# Patient Record
Sex: Female | Born: 2000 | Race: White | Hispanic: No | Marital: Single | Smoking: Never smoker
Health system: Southern US, Community
[De-identification: ages and names within clinical notes are randomized; demographics above are authoritative.]

---
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
## 2018-09-18 ENCOUNTER — Telehealth: Payer: Self-pay | Admitting: Family Medicine

## 2018-09-18 NOTE — Telephone Encounter (Signed)
She was given a years supply in April of last year- she should not be due, but if she is, let me know.

## 2018-09-18 NOTE — Telephone Encounter (Signed)
Copied from CRM [id number]. Topic: Quick Communication - Rx Refill/Question >> Sep 18, 2018 10:26 AM Baldo Daub L wrote: Medication: norethindrone-ethinyl estradiol (JUNEL FE 1/20) 1-20 MG-MCG tablet  Has the patient contacted their pharmacy? Yes - needs new script (Agent: If no, request that the patient contact the pharmacy for the refill.) (Agent: If yes, when and what did the pharmacy advise?)  Preferred Pharmacy (with phone number or street name): [id number] - [place], [place] - [address] [phone] (Phone) [phone] (Fax)    Agent: Please be advised that RX refills may take up to 3 business days. We ask that you follow-up with your pharmacy.

## 2018-09-18 NOTE — Telephone Encounter (Signed)
Spoke w/ pharmacy. She still has 2 refills available.

## 2018-10-29 ENCOUNTER — Encounter: Payer: 59 | Admitting: Family Medicine

## 2018-10-30 ENCOUNTER — Encounter: Payer: Self-pay | Admitting: Family Medicine

## 2018-10-30 ENCOUNTER — Ambulatory Visit (INDEPENDENT_AMBULATORY_CARE_PROVIDER_SITE_OTHER): Payer: Managed Care, Other (non HMO) | Admitting: Family Medicine

## 2018-10-30 ENCOUNTER — Other Ambulatory Visit: Payer: Self-pay

## 2018-10-30 DIAGNOSIS — Z3041 Encounter for surveillance of contraceptive pills: Secondary | ICD-10-CM | POA: Diagnosis not present

## 2018-10-30 MED ORDER — NORETHIN ACE-ETH ESTRAD-FE 1-20 MG-MCG PO TABS: 1.0000 | ORAL_TABLET | Freq: Every day | ORAL | 3 refills | Status: DC

## 2018-10-30 NOTE — Progress Notes (Signed)
LMP 10/26/2018 (Exact Date)    Subjective:    Patient ID: Katie Bradford, female    DOB: 2001/05/15, 18 y.o.   MRN: [id number]  HPI: Katie Bradford is a 18 y.o. female  Chief Complaint  Patient presents with  . Contraception    needs refill   CONTRACEPTION CONCERNS Contraception: OCP Previous contraception: OCP  Sexual activity: practicing safe sex Average interval between menses: 28 days Length of menses: less than a week  Flow: lighter Dysmenorrhea: no  Relevant past medical, surgical, family and social history reviewed and updated as indicated. Interim medical history since our last visit reviewed. Allergies and medications reviewed and updated.  Review of Systems  Constitutional: Negative.   Respiratory: Negative.   Cardiovascular: Negative.   Psychiatric/Behavioral: Negative.     Per HPI unless specifically indicated above     Objective:    LMP 10/26/2018 (Exact Date)   Wt Readings from Last 3 Encounters:  10/21/17 142 lb 9.6 oz (64.7 kg) (75 %, Z= 0.67)*  10/18/16 132 lb (59.9 kg) (65 %, Z= 0.37)*  03/28/16 135 lb (61.2 kg) (71 %, Z= 0.55)*   * Growth percentiles are based on CDC (Girls, 2-20 Years) data.    Physical Exam Vitals signs and nursing note reviewed.  Constitutional:      General: She is not in acute distress.    Appearance: Normal appearance. She is not ill-appearing, toxic-appearing or diaphoretic.  HENT:     Head: Normocephalic and atraumatic.     Right Ear: External ear normal.     Left Ear: External ear normal.     Nose: Nose normal.     Mouth/Throat:     Mouth: Mucous membranes are moist.     Pharynx: Oropharynx is clear.  Eyes:     General: No scleral icterus.       Right eye: No discharge.        Left eye: No discharge.     Conjunctiva/sclera: Conjunctivae normal.     Pupils: Pupils are equal, round, and reactive to light.  Neck:     Musculoskeletal: Normal range of motion.  Pulmonary:     Effort: Pulmonary effort is  normal. No respiratory distress.     Comments: Speaking in full sentences Musculoskeletal: Normal range of motion.  Skin:    Coloration: Skin is not jaundiced or pale.     Findings: No bruising, erythema, lesion or rash.  Neurological:     Mental Status: She is alert and oriented to person, place, and time. Mental status is at baseline.  Psychiatric:        Mood and Affect: Mood normal.        Behavior: Behavior normal.        Thought Content: Thought content normal.        Judgment: Judgment normal.     Results for orders placed or performed in visit on 10/21/17  GC/Chlamydia Probe Amp  Result Value Ref Range   Chlamydia trachomatis, NAA Negative Negative   Neisseria gonorrhoeae by PCR Negative Negative  Microscopic Examination  Result Value Ref Range   WBC, UA 0-5 0 - 5 /hpf   RBC, UA None seen 0 - 2 /hpf   Epithelial Cells (non renal) 0-10 0 - 10 /hpf   Bacteria, UA Few None seen/Few  Urine Culture, Reflex  Result Value Ref Range   Urine Culture, Routine Final report    Organism ID, Bacteria Comment   HIV antibody (with reflex)  Result Value  Ref Range   HIV Screen 4th Generation wRfx Non Reactive Non Reactive  CBC with Differential/Platelet  Result Value Ref Range   WBC 5.3 3.4 - 10.8 x10E3/uL   RBC 4.81 3.77 - 5.28 x10E6/uL   Hemoglobin 14.5 11.1 - 15.9 g/dL   Hematocrit 16.143.2 09.634.0 - 46.6 %   MCV 90 79 - 97 fL   MCH 30.1 26.6 - 33.0 pg   MCHC 33.6 31.5 - 35.7 g/dL   RDW 04.513.2 40.912.3 - 81.115.4 %   Platelets 316 150 - 379 x10E3/uL   Neutrophils 48 Not Estab. %   Lymphs 41 Not Estab. %   Monocytes 9 Not Estab. %   Eos 2 Not Estab. %   Basos 0 Not Estab. %   Neutrophils Absolute 2.5 1.4 - 7.0 x10E3/uL   Lymphocytes Absolute 2.2 0.7 - 3.1 x10E3/uL   Monocytes Absolute 0.5 0.1 - 0.9 x10E3/uL   EOS (ABSOLUTE) 0.1 0.0 - 0.4 x10E3/uL   Basophils Absolute 0.0 0.0 - 0.2 x10E3/uL   Immature Granulocytes 0 Not Estab. %   Immature Grans (Abs) 0.0 0.0 - 0.1 x10E3/uL   Comprehensive metabolic panel  Result Value Ref Range   Glucose 77 65 - 99 mg/dL   BUN 10 6 - 20 mg/dL   Creatinine, Ser 9.140.86 0.57 - 1.00 mg/dL   GFR calc non Af Amer 98 >59 mL/min/1.73   GFR calc Af Amer 113 >59 mL/min/1.73   BUN/Creatinine Ratio 12 9 - 23   Sodium 139 134 - 144 mmol/L   Potassium 3.7 3.5 - 5.2 mmol/L   Chloride 100 96 - 106 mmol/L   CO2 24 20 - 29 mmol/L   Calcium 9.8 8.7 - 10.2 mg/dL   Total Protein 7.2 6.0 - 8.5 g/dL   Albumin 4.2 3.5 - 5.5 g/dL   Globulin, Total 3.0 1.5 - 4.5 g/dL   Albumin/Globulin Ratio 1.4 1.2 - 2.2   Bilirubin Total 0.8 0.0 - 1.2 mg/dL   Alkaline Phosphatase 76 39 - 117 IU/L   AST 27 0 - 40 IU/L   ALT 21 0 - 32 IU/L  Lipid Panel w/o Chol/HDL Ratio  Result Value Ref Range   Cholesterol, Total 179 (H) 100 - 169 mg/dL   Triglycerides 782129 (H) 0 - 89 mg/dL   HDL 54 >95>39 mg/dL   VLDL Cholesterol Cal 26 5 - 40 mg/dL   LDL Calculated 99 0 - 109 mg/dL  TSH  Result Value Ref Range   TSH 1.730 0.450 - 4.500 uIU/mL  UA/M w/rflx Culture, Routine  Result Value Ref Range   Specific Gravity, UA 1.015 1.005 - 1.030   pH, UA 6.0 5.0 - 7.5   Color, UA Yellow Yellow   Appearance Ur Hazy (A) Clear   Leukocytes, UA 1+ (A) Negative   Protein, UA Negative Negative/Trace   Glucose, UA Negative Negative   Ketones, UA Negative Negative   RBC, UA Negative Negative   Bilirubin, UA Negative Negative   Urobilinogen, Ur 0.2 0.2 - 1.0 mg/dL   Nitrite, UA Negative Negative   Microscopic Examination See below:    Urinalysis Reflex Comment   HSV(herpes simplex vrs) 1+2 ab-IgG  Result Value Ref Range   HSV 1 Glycoprotein G Ab, IgG <0.91 0.00 - 0.90 index   HSV 2 IgG, Type Spec <0.91 0.00 - 0.90 index  RPR  Result Value Ref Range   RPR Ser Ql Non Reactive Non Reactive      Assessment & Plan:   Problem  List Items Addressed This Visit    None    Visit Diagnoses    Encounter for surveillance of contraceptive pills    -  Primary   Tolerating medicine  well. No concerns. Refills given. Will follow up for physical after pandemic.        Follow up plan: Return in about 3 months (around 01/29/2019) for Physical.    . This visit was completed via FaceTime due to the restrictions of the COVID-19 pandemic. All issues as above were discussed and addressed. Physical exam was done as above through visual confirmation on FaceTime. If it was felt that the patient should be evaluated in the office, they were directed there. The patient verbally consented to this visit. . Location of the patient: home . Location of the provider: home . Those involved with this call:  . Provider: Olevia Perches, DO . CMA: Sheilah Mins, CMA . Front Desk/Registration: Adela Ports  . Time spent on call: 15 minutes with patient face to face via video conference. More than 50% of this time was spent in counseling and coordination of care. 23 minutes total spent in review of patient's record and preparation of their chart.

## 2018-12-11 ENCOUNTER — Other Ambulatory Visit: Payer: Self-pay | Admitting: Family Medicine

## 2019-01-20 ENCOUNTER — Encounter: Payer: Self-pay | Admitting: Family Medicine

## 2019-01-20 ENCOUNTER — Other Ambulatory Visit: Payer: Self-pay

## 2019-01-20 ENCOUNTER — Ambulatory Visit (INDEPENDENT_AMBULATORY_CARE_PROVIDER_SITE_OTHER): Payer: Managed Care, Other (non HMO) | Admitting: Family Medicine

## 2019-01-20 VITALS — BP 117/77 | HR 80 | Temp 100.1°F | Ht 64.2 in | Wt 139.0 lb

## 2019-01-20 DIAGNOSIS — Z0000 Encounter for general adult medical examination without abnormal findings: Secondary | ICD-10-CM | POA: Diagnosis not present

## 2019-01-20 DIAGNOSIS — R509 Fever, unspecified: Secondary | ICD-10-CM

## 2019-01-20 NOTE — Progress Notes (Signed)
BP 117/77   Pulse 80   Temp 100.1 F (37.8 C) (Oral)   Ht 5' 4.2" (1.631 m)   Wt 139 lb (63 kg)   SpO2 98%   BMI 23.71 kg/m    Subjective:    Patient ID: Katie Bradford, female    DOB: August 21, 2000, 18 y.o.   MRN: [id number]  HPI: Katie Bradford is a 18 y.o. female presenting on 01/20/2019 for comprehensive medical examination. Current medical complaints include: Fever- she didn't know she had one. Otherwise feeling well. No sign of URI symptoms. Has not been around any known exposures. No other concerns or complaints  Menopausal Symptoms: no  Depression Screen done today and results listed below:  [facility] 01/20/2019 10/21/2017 10/18/2016 05/06/2015  Decreased Interest 0 0 0 0  Down, Depressed, Hopeless 0 0 0 0  PHQ - 2 Score 0 0 0 0  Altered sleeping 0 0 - -  Tired, decreased energy 0 1 - -  Change in appetite 0 1 - -  Feeling bad or failure about yourself  0 1 - -  Trouble concentrating 0 0 - -  Moving slowly or fidgety/restless 0 0 - -  Suicidal thoughts 0 0 - -  PHQ-9 Score 0 3 - -  Difficult doing work/chores Not difficult at all - - -    Past Medical History:  History reviewed. No pertinent past medical history.  Surgical History:  History reviewed. No pertinent surgical history.  Medications:  Current Outpatient Medications on File Prior to Visit  Medication Sig  . DORYX MPC 120 MG TBEC Take 1 tablet by mouth daily.  Marland Kitchen. EPIDUO FORTE 0.3-2.5 % GEL Apply A pea sized AMOUNT TO THE entire FACE EVERY NIGHT AT BEDTIME  . JUNEL FE 1/20 1-20 MG-MCG tablet TAKE 1 TABLET BY MOUTH EVERY DAY   No current facility-administered medications on file prior to visit.     Allergies:  No Known Allergies  Social History:  Social History   Socioeconomic History  . Marital status: Single    Spouse name: Not on file  . Number of children: Not on file  . Years of education: Not on file  . Highest education level: Not on file  Occupational History  . Not on  file  Social Needs  . Financial resource strain: Not on file  . Food insecurity    Worry: Not on file    Inability: Not on file  . Transportation needs    Medical: Not on file    Non-medical: Not on file  Tobacco Use  . Smoking status: Never Smoker  . Smokeless tobacco: Never Used  Substance and Sexual Activity  . Alcohol use: No  . Drug use: No  . Sexual activity: Not on file  Lifestyle  . Physical activity    Days per week: Not on file    Minutes per session: Not on file  . Stress: Not on file  Relationships  . Social Musicianconnections    Talks on phone: Not on file    Gets together: Not on file    Attends religious service: Not on file    Active member of club or organization: Not on file    Attends meetings of clubs or organizations: Not on file    Relationship status: Not on file  . Intimate partner violence    Fear of current or ex partner: Not on file    Emotionally abused: Not on file    Physically abused:  Not on file    Forced sexual activity: Not on file  Other Topics Concern  . Not on file  Social History Narrative  . Not on file   Social History   Tobacco Use  Smoking Status Never Smoker  Smokeless Tobacco Never Used   Social History   Substance and Sexual Activity  Alcohol Use No    Family History:  Family History  Problem Relation Age of Onset  . Heart disease Father   . Hypertension Father   . Cancer Maternal Grandfather        pancreatic  . Diabetes Paternal Aunt   . Diabetes Paternal Uncle   . Diabetes Paternal Grandmother   . Stroke Neg Hx   . COPD Neg Hx     Past medical history, surgical history, medications, allergies, family history and social history reviewed with patient today and changes made to appropriate areas of the chart.   Review of Systems  Constitutional: Positive for fever. Negative for chills, diaphoresis, malaise/fatigue and weight loss.  HENT: Negative.   Eyes: Negative.   Respiratory: Negative.   Cardiovascular:  Negative.   Gastrointestinal: Negative.   Genitourinary: Negative.   Musculoskeletal: Negative.   Skin: Negative.   Neurological: Negative.   Endo/Heme/Allergies: Negative.   Psychiatric/Behavioral: Negative.     All other ROS negative except what is listed above and in the HPI.      Objective:    BP 117/77   Pulse 80   Temp 100.1 F (37.8 C) (Oral)   Ht 5' 4.2" (1.631 m)   Wt 139 lb (63 kg)   SpO2 98%   BMI 23.71 kg/m   Wt Readings from Last 3 Encounters:  01/20/19 139 lb (63 kg)  10/21/17 142 lb 9.6 oz (64.7 kg) (75 %, Z= 0.67)*  10/18/16 132 lb (59.9 kg) (65 %, Z= 0.37)*   * Growth percentiles are based on CDC (Girls, 2-20 Years) data.    Physical Exam Vitals signs and nursing note reviewed.  Constitutional:      General: She is not in acute distress.    Appearance: Normal appearance. She is not ill-appearing, toxic-appearing or diaphoretic.  HENT:     Head: Normocephalic and atraumatic.     Right Ear: Tympanic membrane, ear canal and external ear normal. There is no impacted cerumen.     Left Ear: Tympanic membrane, ear canal and external ear normal. There is no impacted cerumen.     Nose: Nose normal. No congestion or rhinorrhea.     Mouth/Throat:     Mouth: Mucous membranes are moist.     Pharynx: Oropharynx is clear. No oropharyngeal exudate or posterior oropharyngeal erythema.  Eyes:     General: No scleral icterus.       Right eye: No discharge.        Left eye: No discharge.     Extraocular Movements: Extraocular movements intact.     Conjunctiva/sclera: Conjunctivae normal.     Pupils: Pupils are equal, round, and reactive to light.  Neck:     Musculoskeletal: Normal range of motion and neck supple. No neck rigidity or muscular tenderness.     Vascular: No carotid bruit.  Cardiovascular:     Rate and Rhythm: Normal rate and regular rhythm.     Pulses: Normal pulses.     Heart sounds: No murmur. No friction rub. No gallop.   Pulmonary:     Effort:  Pulmonary effort is normal. No respiratory distress.  Breath sounds: Normal breath sounds. No stridor. No wheezing, rhonchi or rales.  Chest:     Chest wall: No tenderness.  Abdominal:     General: Abdomen is flat. Bowel sounds are normal. There is no distension.     Palpations: Abdomen is soft. There is no mass.     Tenderness: There is no abdominal tenderness. There is no right CVA tenderness, left CVA tenderness, guarding or rebound.     Hernia: No hernia is present.  Genitourinary:    Comments: Breast and pelvic exams deferred with shared decision making Musculoskeletal:        General: No swelling, tenderness, deformity or signs of injury.     Right lower leg: No edema.     Left lower leg: No edema.  Lymphadenopathy:     Cervical: No cervical adenopathy.  Skin:    General: Skin is warm and dry.     Capillary Refill: Capillary refill takes less than 2 seconds.     Coloration: Skin is not jaundiced or pale.     Findings: No bruising, erythema, lesion or rash.  Neurological:     General: No focal deficit present.     Mental Status: She is alert and oriented to person, place, and time. Mental status is at baseline.     Cranial Nerves: No cranial nerve deficit.     Sensory: No sensory deficit.     Motor: No weakness.     Coordination: Coordination normal.     Gait: Gait normal.     Deep Tendon Reflexes: Reflexes normal.  Psychiatric:        Mood and Affect: Mood normal.        Behavior: Behavior normal.        Thought Content: Thought content normal.        Judgment: Judgment normal.     Results for orders placed or performed in visit on 10/21/17  GC/Chlamydia Probe Amp   Specimen: Urine   UR  Result Value Ref Range   Chlamydia trachomatis, NAA Negative Negative   Neisseria gonorrhoeae by PCR Negative Negative  Microscopic Examination   URINE  Result Value Ref Range   WBC, UA 0-5 0 - 5 /hpf   RBC, UA None seen 0 - 2 /hpf   Epithelial Cells (non renal) 0-10 0 - 10  /hpf   Bacteria, UA Few None seen/Few  Urine Culture, Reflex   URINE  Result Value Ref Range   Urine Culture, Routine Final report    Organism ID, Bacteria Comment   HIV antibody (with reflex)  Result Value Ref Range   HIV Screen 4th Generation wRfx Non Reactive Non Reactive  CBC with Differential/Platelet  Result Value Ref Range   WBC 5.3 3.4 - 10.8 x10E3/uL   RBC 4.81 3.77 - 5.28 x10E6/uL   Hemoglobin 14.5 11.1 - 15.9 g/dL   Hematocrit 16.1 09.6 - 46.6 %   MCV 90 79 - 97 fL   MCH 30.1 26.6 - 33.0 pg   MCHC 33.6 31.5 - 35.7 g/dL   RDW 04.5 40.9 - 81.1 %   Platelets 316 150 - 379 x10E3/uL   Neutrophils 48 Not Estab. %   Lymphs 41 Not Estab. %   Monocytes 9 Not Estab. %   Eos 2 Not Estab. %   Basos 0 Not Estab. %   Neutrophils Absolute 2.5 1.4 - 7.0 x10E3/uL   Lymphocytes Absolute 2.2 0.7 - 3.1 x10E3/uL   Monocytes Absolute 0.5 0.1 - 0.9 x10E3/uL  EOS (ABSOLUTE) 0.1 0.0 - 0.4 x10E3/uL   Basophils Absolute 0.0 0.0 - 0.2 x10E3/uL   Immature Granulocytes 0 Not Estab. %   Immature Grans (Abs) 0.0 0.0 - 0.1 x10E3/uL  Comprehensive metabolic panel  Result Value Ref Range   Glucose 77 65 - 99 mg/dL   BUN 10 6 - 20 mg/dL   Creatinine, Ser 1.300.86 0.57 - 1.00 mg/dL   GFR calc non Af Amer 98 >59 mL/min/1.73   GFR calc Af Amer 113 >59 mL/min/1.73   BUN/Creatinine Ratio 12 9 - 23   Sodium 139 134 - 144 mmol/L   Potassium 3.7 3.5 - 5.2 mmol/L   Chloride 100 96 - 106 mmol/L   CO2 24 20 - 29 mmol/L   Calcium 9.8 8.7 - 10.2 mg/dL   Total Protein 7.2 6.0 - 8.5 g/dL   Albumin 4.2 3.5 - 5.5 g/dL   Globulin, Total 3.0 1.5 - 4.5 g/dL   Albumin/Globulin Ratio 1.4 1.2 - 2.2   Bilirubin Total 0.8 0.0 - 1.2 mg/dL   Alkaline Phosphatase 76 39 - 117 IU/L   AST 27 0 - 40 IU/L   ALT 21 0 - 32 IU/L  Lipid Panel w/o Chol/HDL Ratio  Result Value Ref Range   Cholesterol, Total 179 (H) 100 - 169 mg/dL   Triglycerides 865129 (H) 0 - 89 mg/dL   HDL 54 >78>39 mg/dL   VLDL Cholesterol Cal 26 5 - 40 mg/dL    LDL Calculated 99 0 - 109 mg/dL  TSH  Result Value Ref Range   TSH 1.730 0.450 - 4.500 uIU/mL  UA/M w/rflx Culture, Routine   Specimen: Urine   URINE  Result Value Ref Range   Specific Gravity, UA 1.015 1.005 - 1.030   pH, UA 6.0 5.0 - 7.5   Color, UA Yellow Yellow   Appearance Ur Hazy (A) Clear   Leukocytes, UA 1+ (A) Negative   Protein, UA Negative Negative/Trace   Glucose, UA Negative Negative   Ketones, UA Negative Negative   RBC, UA Negative Negative   Bilirubin, UA Negative Negative   Urobilinogen, Ur 0.2 0.2 - 1.0 mg/dL   Nitrite, UA Negative Negative   Microscopic Examination See below:    Urinalysis Reflex Comment   HSV(herpes simplex vrs) 1+2 ab-IgG  Result Value Ref Range   HSV 1 Glycoprotein G Ab, IgG <0.91 0.00 - 0.90 index   HSV 2 IgG, Type Spec <0.91 0.00 - 0.90 index  RPR  Result Value Ref Range   RPR Ser Ql Non Reactive Non Reactive      Assessment & Plan:   Problem List Items Addressed This Visit    None    Visit Diagnoses    Routine general medical examination at a health care facility    -  Primary   Vaccines up to date. Screening labs checked today. Continue diet and exercise. Call with any concerns.    Relevant Orders   CBC with Differential/Platelet   Comprehensive metabolic panel   Lipid Panel w/o Chol/HDL Ratio   TSH   UA/M w/rflx Culture, Routine   Fever, unspecified fever cause       Otherwise asymptomatic. Will get her set up for COVID-19 testing. self-quarantine until results come back.    Relevant Orders   Novel Coronavirus, NAA ([facility])       Follow up plan: Return in about 1 year (around 01/20/2020) for Physical.   LABORATORY TESTING:  - Pap smear: not applicable  IMMUNIZATIONS:   -  Tdap: Tetanus vaccination status reviewed: last tetanus booster within 10 years. - Influenza: Postponed to flu season - Pneumovax: Not applicable   PATIENT COUNSELING:   Advised to take 1 mg of folate supplement per day if capable of  pregnancy.   Sexuality: Discussed sexually transmitted diseases, partner selection, use of condoms, avoidance of unintended pregnancy  and contraceptive alternatives.   Advised to avoid cigarette smoking.  I discussed with the patient that most people either abstain from alcohol or drink within safe limits (<=14/week and <=4 drinks/occasion for males, <=7/weeks and <= 3 drinks/occasion for females) and that the risk for alcohol disorders and other health effects rises proportionally with the number of drinks per week and how often a drinker exceeds daily limits.  Discussed cessation/primary prevention of drug use and availability of treatment for abuse.   Diet: Encouraged to adjust caloric intake to maintain  or achieve ideal body weight, to reduce intake of dietary saturated fat and total fat, to limit sodium intake by avoiding high sodium foods and not adding table salt, and to maintain adequate dietary potassium and calcium preferably from fresh fruits, vegetables, and low-fat dairy products.    stressed the importance of regular exercise  Injury prevention: Discussed safety belts, safety helmets, smoke detector, smoking near bedding or upholstery.   Dental health: Discussed importance of regular tooth brushing, flossing, and dental visits.    NEXT PREVENTATIVE PHYSICAL DUE IN 1 YEAR. Return in about 1 year (around 01/20/2020) for Physical.

## 2019-01-20 NOTE — Patient Instructions (Addendum)
We are putting in the order for you to have your COVID testing done. [facility]. You can go for testing from 8AM to 3:30PM Monday-Friday. The results are taking 7-10 days to come back. We will call you with your test results when they come back. While you are waiting for your results, you need to self-quarantine. Self-quarantine means that you do not leave your house. You do not go to the store or drive around. You try to avoid the other people who live with you (stay in a different room and wear a mask when you have to be in contact with them.) Your family members and house-mates need to also be aware that you are being tested in case they develop any symptoms. If you become significantly worse and cannot breathe while waiting for results, call 911. If you have any questions or are concerned while you're waiting for your results- please call our office or send a mychart message.    Health Maintenance, Female Adopting a healthy lifestyle and getting preventive care are important in promoting health and wellness. Ask your health care provider about:  The right schedule for you to have regular tests and exams.  Things you can do on your own to prevent diseases and keep yourself healthy. What should I know about diet, weight, and exercise? Eat a healthy diet   Eat a diet that includes plenty of vegetables, fruits, low-fat dairy products, and lean protein.  Do not eat a lot of foods that are high in solid fats, added sugars, or sodium. Maintain a healthy weight Body mass index (BMI) is used to identify weight problems. It estimates body fat based on height and weight. Your health care provider can help determine your BMI and help you achieve or maintain a healthy weight. Get regular exercise Get regular exercise. This is one of the most important things you can do for your health. Most adults should:  Exercise for at least 150 minutes  each week. The exercise should increase your heart rate and make you sweat (moderate-intensity exercise).  Do strengthening exercises at least twice a week. This is in addition to the moderate-intensity exercise.  Spend less time sitting. Even light physical activity can be beneficial. Watch cholesterol and blood lipids Have your blood tested for lipids and cholesterol at 20 years of age, then have this test every 5 years. Have your cholesterol levels checked more often if:  Your lipid or cholesterol levels are high.  You are older than 20 years of age.  You are at high risk for heart disease. What should I know about cancer screening? Depending on your health history and family history, you may need to have cancer screening at various ages. This may include screening for:  Breast cancer.  Cervical cancer.  Colorectal cancer.  Skin cancer.  Lung cancer. What should I know about heart disease, diabetes, and high blood pressure? Blood pressure and heart disease  High blood pressure causes heart disease and increases the risk of stroke. This is more likely to develop in people who have high blood pressure readings, are of African descent, or are overweight.  Have your blood pressure checked: ? Every 3-5 years if you are 8518-839 years of age. ? Every year if you are 20 years old or older. Diabetes Have regular diabetes screenings. This checks your fasting blood sugar level. Have the screening done:  Once every three years after age 20  if you are at a normal weight and have a low risk for diabetes.  More often and at a younger age if you are overweight or have a high risk for diabetes. What should I know about preventing infection? Hepatitis B If you have a higher risk for hepatitis B, you should be screened for this virus. Talk with your health care provider to find out if you are at risk for hepatitis B infection. Hepatitis C Testing is recommended for:  Everyone born from  [birth year] through 1967.  Anyone with known risk factors for hepatitis C. Sexually transmitted infections (STIs)  Get screened for STIs, including gonorrhea and chlamydia, if: ? You are sexually active and are younger than 20 years of age. ? You are older than 20 years of age and your health care provider tells you that you are at risk for this type of infection. ? Your sexual activity has changed since you were last screened, and you are at increased risk for chlamydia or gonorrhea. Ask your health care provider if you are at risk.  Ask your health care provider about whether you are at high risk for HIV. Your health care provider may recommend a prescription medicine to help prevent HIV infection. If you choose to take medicine to prevent HIV, you should first get tested for HIV. You should then be tested every 3 months for as long as you are taking the medicine. Pregnancy  If you are about to stop having your period (premenopausal) and you may become pregnant, seek counseling before you get pregnant.  Take 400 to 800 micrograms (mcg) of folic acid every day if you become pregnant.  Ask for birth control (contraception) if you want to prevent pregnancy. Osteoporosis and menopause Osteoporosis is a disease in which the bones lose minerals and strength with aging. This can result in bone fractures. If you are 22 years old or older, or if you are at risk for osteoporosis and fractures, ask your health care provider if you should:  Be screened for bone loss.  Take a calcium or vitamin D supplement to lower your risk of fractures.  Be given hormone replacement therapy (HRT) to treat symptoms of menopause. Follow these instructions at home: Lifestyle  Do not use any products that contain nicotine or tobacco, such as cigarettes, e-cigarettes, and chewing tobacco. If you need help quitting, ask your health care provider.  Do not use street drugs.  Do not share needles.  Ask your health care  provider for help if you need support or information about quitting drugs. Alcohol use  Do not drink alcohol if: ? Your health care provider tells you not to drink. ? You are pregnant, may be pregnant, or are planning to become pregnant.  If you drink alcohol: ? Limit how much you use to 0-1 drink a day. ? Limit intake if you are breastfeeding.  Be aware of how much alcohol is in your drink. In the [place] one drink equals one 12 oz bottle of beer (355 mL), one 5 oz glass of wine (148 mL), or one 1 oz glass of hard liquor (44 mL). General instructions  Schedule regular health, dental, and eye exams.  Stay current with your vaccines.  Tell your health care provider if: ? You often feel depressed. ? You have ever been abused or do not feel safe at home. Summary  Adopting a healthy lifestyle and getting preventive care are important in promoting health and wellness.  Follow your health care  provider's instructions about healthy diet, exercising, and getting tested or screened for diseases.  Follow your health care provider's instructions on monitoring your cholesterol and blood pressure. This information is not intended to replace advice given to you by your health care provider. Make sure you discuss any questions you have with your health care provider. Document Released: 01/01/2011 Document Revised: 06/11/2018 Document Reviewed: 06/11/2018 Elsevier Patient Education  2020 [employer].

## 2019-01-21 ENCOUNTER — Encounter: Payer: Self-pay | Admitting: Family Medicine

## 2019-01-21 LAB — CBC WITH DIFFERENTIAL/PLATELET
Basophils Absolute: 0 10*3/uL (ref 0.0–0.2)
Basos: 1 %
EOS (ABSOLUTE): 0.1 10*3/uL (ref 0.0–0.4)
Eos: 2 %
Hematocrit: 43.9 % (ref 34.0–46.6)
Hemoglobin: 14.7 g/dL (ref 11.1–15.9)
Immature Grans (Abs): 0 10*3/uL (ref 0.0–0.1)
Immature Granulocytes: 0 %
Lymphocytes Absolute: 2.3 10*3/uL (ref 0.7–3.1)
Lymphs: 42 %
MCH: 31 pg (ref 26.6–33.0)
MCHC: 33.5 g/dL (ref 31.5–35.7)
MCV: 93 fL (ref 79–97)
Monocytes Absolute: 0.3 10*3/uL (ref 0.1–0.9)
Monocytes: 6 %
Neutrophils Absolute: 2.7 10*3/uL (ref 1.4–7.0)
Neutrophils: 49 %
Platelets: 244 10*3/uL (ref 150–450)
RBC: 4.74 x10E6/uL (ref 3.77–5.28)
RDW: 12.2 % (ref 11.7–15.4)
WBC: 5.5 10*3/uL (ref 3.4–10.8)

## 2019-01-21 LAB — TSH: TSH: 2.47 u[IU]/mL (ref 0.450–4.500)

## 2019-01-21 LAB — COMPREHENSIVE METABOLIC PANEL
ALT: 9 IU/L (ref 0–32)
AST: 19 IU/L (ref 0–40)
Albumin/Globulin Ratio: 1.6 (ref 1.2–2.2)
Albumin: 4.3 g/dL (ref 3.9–5.0)
Alkaline Phosphatase: 74 IU/L (ref 39–117)
BUN/Creatinine Ratio: 10 (ref 9–23)
BUN: 10 mg/dL (ref 6–20)
Bilirubin Total: 1.2 mg/dL (ref 0.0–1.2)
CO2: 21 mmol/L (ref 20–29)
Calcium: 9.3 mg/dL (ref 8.7–10.2)
Chloride: 103 mmol/L (ref 96–106)
Creatinine, Ser: 1 mg/dL (ref 0.57–1.00)
GFR calc Af Amer: 94 mL/min/{1.73_m2} (ref >59)
GFR calc non Af Amer: 81 mL/min/{1.73_m2} (ref >59)
Globulin, Total: 2.7 g/dL (ref 1.5–4.5)
Glucose: 87 mg/dL (ref 65–99)
Potassium: 4.1 mmol/L (ref 3.5–5.2)
Sodium: 140 mmol/L (ref 134–144)
Total Protein: 7 g/dL (ref 6.0–8.5)

## 2019-01-21 LAB — LIPID PANEL W/O CHOL/HDL RATIO
Cholesterol, Total: 177 mg/dL (ref 100–199)
HDL: 47 mg/dL (ref >39)
LDL Calculated: 106 mg/dL — ABNORMAL HIGH (ref 0–99)
Triglycerides: 119 mg/dL (ref 0–149)
VLDL Cholesterol Cal: 24 mg/dL (ref 5–40)

## 2019-01-22 ENCOUNTER — Telehealth: Payer: Self-pay | Admitting: Family Medicine

## 2019-01-22 LAB — UA/M W/RFLX CULTURE, ROUTINE
Bilirubin, UA: NEGATIVE
Glucose, UA: NEGATIVE
Leukocytes,UA: NEGATIVE
Nitrite, UA: NEGATIVE
Specific Gravity, UA: 1.025 (ref 1.005–1.030)
Urobilinogen, Ur: 0.2 mg/dL (ref 0.2–1.0)
pH, UA: 6 (ref 5.0–7.5)

## 2019-01-22 LAB — MICROSCOPIC EXAMINATION

## 2019-01-22 LAB — URINE CULTURE, REFLEX

## 2019-01-22 LAB — NOVEL CORONAVIRUS, NAA: SARS-CoV-2, NAA: NOT DETECTED

## 2019-01-22 NOTE — Telephone Encounter (Signed)
Please let Troi know that her COVID test came back negative. Thanks!

## 2019-01-23 NOTE — Telephone Encounter (Signed)
Patient notified

## 2019-04-13 ENCOUNTER — Other Ambulatory Visit: Payer: Self-pay

## 2019-04-13 ENCOUNTER — Ambulatory Visit: Payer: Managed Care, Other (non HMO) | Admitting: Family Medicine

## 2019-04-13 ENCOUNTER — Encounter: Payer: Self-pay | Admitting: Family Medicine

## 2019-04-13 VITALS — BP 122/79 | HR 85 | Temp 98.5°F | Ht 64.2 in | Wt 135.0 lb

## 2019-04-13 DIAGNOSIS — Z23 Encounter for immunization: Secondary | ICD-10-CM

## 2019-04-13 DIAGNOSIS — Z308 Encounter for other contraceptive management: Secondary | ICD-10-CM

## 2019-04-13 NOTE — Patient Instructions (Signed)
Contraceptive Implant Information A contraceptive implant is a small, plastic rod that is inserted under the skin. The implant releases a hormone into the bloodstream that prevents pregnancy. Contraceptive implants can be effective for up to 3 years. They do not provide protection against STIs (sexually transmitted infections). How does the implant work? Contraceptive implants prevent pregnancy by releasing a small amount of progestin into the bloodstream. Progestin has similar effects to the hormone progesterone, which plays a role in menstrual periods and pregnancy. Progestin will:  Stop the ovaries from releasing eggs.  Thicken cervical mucus to prevent sperm from entering the cervix.  Thin out the lining of the uterus to prevent a fertilized egg from attaching to the wall of the uterus. What are the advantages of this form of birth control? The advantages of this form of birth control include the following:  It is very effective at preventing pregnancy.  It is effective for up to 3 years.  It can easily be removed.  It does not interfere with sex or daily activities.  It can be used when breastfeeding.  It can be used by women who cannot take estrogen.  The procedure to insert the device is quick.  Women can get pregnant shortly after removing the device. What are the disadvantages of this form of birth control? The disadvantages of this form of birth control include the following:  It can cause side effects, including: ? Irregular menstrual periods or bleeding. ? Headache. ? Weight gain. ? Acne. ? Breast tenderness. ? Abdomen (abdominal) pain. ? Mood changes, such as depression.  It does not protect against STIs.  You must make an office visit to have it inserted and removed by a trained clinician.  Inserting or removing the device can result in pain, scarring, and tissue or nerve damage (rare). How is this implant inserted? The procedure to insert an implant only  takes a few minutes. During the procedure:  Your upper arm will be numbed with a numbing medicine (local anesthetic).  The implant will be injected under the skin of your upper arm with a needle. After the procedure:  You may experience minor bruising, swelling, or discomfort at the insertion site. This should only last for a couple of days.  You may need to use another, non-hormonal contraceptive such as a condom for 7 days after the procedure. How is the implant removed? The implant should be removed after 3 years or as directed by your health care provider. The procedure to remove the implant only takes a few minutes. During this procedure:  Your upper arm will be numbed with a local anesthetic.  A small incision will be made near the implant.  The implant will be removed with a small pair of forceps. After the implant is removed:  The effect of the implant will wear off a few hours after removal. Most women will be able to get pregnant within 3 weeks of removal.  A new implant can be inserted as soon as the old one is removed, if desired.  You may experience minor bruising, swelling, or discomfort at the removal site. This should only last for a couple of days. Is this implant right for me? Your health care provider can help you determine whether you are good candidate for a contraceptive implant. Make sure to discuss the possible side effects with your health care provider. You should not get the implant if you:  Are pregnant.  Are allergic to any part of the implant.  Have a history of: ? Breast cancer. ? Unusual bleeding from the vagina. ? Heart disease. ? Stroke. ? Liver disease or tumors. ? Migraines. Summary  A contraceptive implant is a small, plastic rod that is inserted under the skin. The implant releases a hormone into the bloodstream that prevents pregnancy.  Contraceptive implants can be effective for up to 3 years.  The implant works by preventing  ovaries from releasing eggs, thickening the cervical mucus, and thinning the uterine wall.  This form of birth control is very effective at preventing pregnancy and can be inserted and removed quickly. Women can get pregnant shortly after the device is removed.  This form of birth control can cause some side effects, including weight gain, breast tenderness, headaches, irregular periods or bleeding, acne, abdominal pain, and depression. It does not provide protection against STIs (sexually transmitted infections). This information is not intended to replace advice given to you by your health care provider. Make sure you discuss any questions you have with your health care provider. Document Released: 06/07/2011 Document Revised: 08/12/2018 Document Reviewed: 06/02/2016 Elsevier Patient Education  2020 [employer]. Levonorgestrel intrauterine device (IUD) What is this medicine? LEVONORGESTREL IUD (LEE voe nor jes trel) is a contraceptive (birth control) device. The device is placed inside the uterus by a healthcare professional. It is used to prevent pregnancy. This device can also be used to treat heavy bleeding that occurs during your period. This medicine may be used for other purposes; ask your health care provider or pharmacist if you have questions. COMMON BRAND NAME(S): Minette Headland What should I tell my health care provider before I take this medicine? They need to know if you have any of these conditions:  abnormal Pap smear  cancer of the breast, uterus, or cervix  diabetes  endometritis  genital or pelvic infection now or in the past  have more than one sexual partner or your partner has more than one partner  heart disease  history of an ectopic or tubal pregnancy  immune system problems  IUD in place  liver disease or tumor  problems with blood clots or take blood-thinners  seizures  use intravenous drugs  uterus of unusual shape  vaginal  bleeding that has not been explained  an unusual or allergic reaction to levonorgestrel, other hormones, silicone, or polyethylene, medicines, foods, dyes, or preservatives  pregnant or trying to get pregnant  breast-feeding How should I use this medicine? This device is placed inside the uterus by a health care professional. Talk to your pediatrician regarding the use of this medicine in children. Special care may be needed. Overdosage: If you think you have taken too much of this medicine contact a poison control center or emergency room at once. NOTE: This medicine is only for you. Do not share this medicine with others. What if I miss a dose? This does not apply. Depending on the brand of device you have inserted, the device will need to be replaced every 3 to 6 years if you wish to continue using this type of birth control. What may interact with this medicine? Do not take this medicine with any of the following medications:  amprenavir  bosentan  fosamprenavir This medicine may also interact with the following medications:  aprepitant  armodafinil  barbiturate medicines for inducing sleep or treating seizures  bexarotene  boceprevir  griseofulvin  medicines to treat seizures like carbamazepine, ethotoin, felbamate, oxcarbazepine, phenytoin, topiramate  modafinil  pioglitazone  rifabutin  rifampin  rifapentine  some medicines to treat HIV infection like atazanavir, efavirenz, indinavir, lopinavir, nelfinavir, tipranavir, ritonavir  St. John's wort  warfarin This list may not describe all possible interactions. Give your health care provider a list of all the medicines, herbs, non-prescription drugs, or dietary supplements you use. Also tell them if you smoke, drink alcohol, or use illegal drugs. Some items may interact with your medicine. What should I watch for while using this medicine? Visit your doctor or health care professional for regular check ups.  See your doctor if you or your partner has sexual contact with others, becomes HIV positive, or gets a sexual transmitted disease. This product does not protect you against HIV infection (AIDS) or other sexually transmitted diseases. You can check the placement of the IUD yourself by reaching up to the top of your vagina with clean fingers to feel the threads. Do not pull on the threads. It is a good habit to check placement after each menstrual period. Call your doctor right away if you feel more of the IUD than just the threads or if you cannot feel the threads at all. The IUD may come out by itself. You may become pregnant if the device comes out. If you notice that the IUD has come out use a backup birth control method like condoms and call your health care provider. Using tampons will not change the position of the IUD and are okay to use during your period. This IUD can be safely scanned with magnetic resonance imaging (MRI) only under specific conditions. Before you have an MRI, tell your healthcare provider that you have an IUD in place, and which type of IUD you have in place. What side effects may I notice from receiving this medicine? Side effects that you should report to your doctor or health care professional as soon as possible:  allergic reactions like skin rash, itching or hives, swelling of the face, lips, or tongue  fever, flu-like symptoms  genital sores  high blood pressure  no menstrual period for 6 weeks during use  pain, swelling, warmth in the leg  pelvic pain or tenderness  severe or sudden headache  signs of pregnancy  stomach cramping  sudden shortness of breath  trouble with balance, talking, or walking  unusual vaginal bleeding, discharge  yellowing of the eyes or skin Side effects that usually do not require medical attention (report to your doctor or health care professional if they continue or are bothersome):  acne  breast pain  change in  sex drive or performance  changes in weight  cramping, dizziness, or faintness while the device is being inserted  headache  irregular menstrual bleeding within first 3 to 6 months of use  nausea This list may not describe all possible side effects. Call your doctor for medical advice about side effects. You may report side effects to FDA at 1-800-FDA-1088. Where should I keep my medicine? This does not apply. NOTE: This sheet is a summary. It may not cover all possible information. If you have questions about this medicine, talk to your doctor, pharmacist, or health care provider.  2020 Elsevier/Gold Standard (2018-04-29 13:22:01)  Intrauterine Device Information An intrauterine device (IUD) is a medical device that is inserted in the uterus to prevent pregnancy. It is a small, T-shaped device that has one or two nylon strings hanging down from it. The strings hang out of the lower part of the uterus (cervix) to allow for future IUD removal. There are two  types of IUDs available:  Hormone IUD. This type of IUD is made of plastic and contains the hormone progestin (synthetic progesterone). A hormone IUD may last 3-5 years.  Copper IUD. This type of IUD has copper wire wrapped around it. A copper IUD may last up to 10 years. How is an IUD inserted? An IUD is inserted through the vagina and placed into the uterus with a minor medical procedure. The exact procedure for IUD insertion may vary among health care providers and hospitals. How does an IUD work? Synthetic progesterone in a hormonal IUD prevents pregnancy by:  Thickening cervical mucus to prevent sperm from entering the uterus.  Thinning the uterine lining to prevent a fertilized egg from being implanted there. Copper in a copper IUD prevents pregnancy by making the uterus and fallopian tubes produce a fluid that kills sperm. What are the advantages of an IUD? Advantages of either type of IUD  It is highly effective in  preventing pregnancy.  It is reversible. You can become pregnant shortly after the IUD is removed.  It is low-maintenance and can stay in place for a long time.  There are no estrogen-related side effects.  It can be used when breastfeeding.  It is not associated with weight gain.  It can be inserted right after childbirth, an abortion, or a miscarriage. Advantages of a hormone IUD  If it is inserted within 7 days of your period starting, it works right after it is inserted. If the hormone IUD is inserted at any other time in your cycle, you will need to use a backup method of birth control for 7 days after insertion.  It can make menstrual periods lighter.  It can reduce menstrual cramping.  It can be used for 3-5 years. Advantages of a copper IUD  It works right after it is inserted.  It can be used as a form of emergency birth control if it is inserted within 5 days after having unprotected sex.  It does not interfere with your body's natural hormones.  It can be used for 10 years. What are the disadvantages of an IUD?  An IUD may cause irregular menstrual bleeding for a period of time after insertion.  You may have pain during insertion and have cramping and vaginal bleeding after insertion.  An IUD may cut the uterus (uterine perforation) when it is inserted. This is rare.  An IUD may cause pelvic inflammatory disease (PID), which is an infection in the uterus and fallopian tubes. This is rare, and it usually happens during the first 20 days after the IUD is inserted.  A copper IUD can make your menstrual flow heavier and more painful. How is an IUD removed?  You will lie on your back with your knees bent and your feet in footrests (stirrups).  A device will be inserted into your vagina to spread apart the vaginal walls (speculum). This will allow your health care provider to see the strings attached to the IUD.  Your health care provider will use a small  instrument (forceps) to grasp the IUD strings and pull firmly until the IUD is removed. You may have some discomfort when the IUD is removed. Your health care provider may recommend taking over-the-counter pain relievers, such as ibuprofen, before the procedure. You may also have minor spotting for a few days after the procedure. The exact procedure for IUD removal may vary among health care providers and hospitals. Is the IUD right for me? Your health care  provider will make sure you are a good candidate for an IUD and will discuss the advantages, disadvantages, and possible side effects with you. Summary  An intrauterine device (IUD) is a medical device that is inserted in the uterus to prevent pregnancy. It is a small, T-shaped device that has one or two nylon strings hanging down from it.  A hormone IUD contains the hormone progestin (synthetic progesterone). A copper IUD has copper wire wrapped around it.  Synthetic progesterone in a hormone IUD prevents pregnancy by thickening cervical mucus and thinning the walls of the uterus. Copper in a copper IUD prevents pregnancy by making the uterus and fallopian tubes produce a fluid that kills sperm.  A hormone IUD can be left in place for 3-5 years. A copper IUD can be left in place for up to 10 years.  An IUD is inserted and removed by a health care provider. You may feel some pain during insertion and removal. Your health care provider may recommend taking over-the-counter pain medicine, such as ibuprofen, before an IUD procedure. This information is not intended to replace advice given to you by your health care provider. Make sure you discuss any questions you have with your health care provider. Document Released: 05/22/2004 Document Revised: 05/31/2017 Document Reviewed: 07/17/2016 Elsevier Patient Education  2020 [employer].

## 2019-04-13 NOTE — Progress Notes (Signed)
BP 122/79   Pulse 85   Temp 98.5 F (36.9 C) (Oral)   Ht 5' 4.2" (1.631 m)   Wt 135 lb (61.2 kg)   SpO2 98%   BMI 23.03 kg/m    Subjective:    Patient ID: Katie Bradford, female    DOB: May 31, 2001, 18 y.o.   MRN: [id number]  HPI: Katie Bradford is a 18 y.o. female  Chief Complaint  Patient presents with  . Contraception    patient would like to discuss some other options of birth cantrol other than birth control pills that she has been taking    CONTRACEPTION CONCERNS- doesn't want to take a pill every day anymore.  Contraception: Barrier Previous contraception: OCP  Sexual activity: practicing careful partner selection Gravida/Para: G0 Average interval between menses: 28 days Length of menses: less than a week Flow: lighter Dysmenorrhea: no  Relevant past medical, surgical, family and social history reviewed and updated as indicated. Interim medical history since our last visit reviewed. Allergies and medications reviewed and updated.  Review of Systems  Constitutional: Negative.   Respiratory: Negative.   Cardiovascular: Negative.   Genitourinary: Negative.   Neurological: Negative.   Psychiatric/Behavioral: Negative.     Per HPI unless specifically indicated above     Objective:    BP 122/79   Pulse 85   Temp 98.5 F (36.9 C) (Oral)   Ht 5' 4.2" (1.631 m)   Wt 135 lb (61.2 kg)   SpO2 98%   BMI 23.03 kg/m   Wt Readings from Last 3 Encounters:  04/13/19 135 lb (61.2 kg)  01/20/19 139 lb (63 kg)  10/21/17 142 lb 9.6 oz (64.7 kg) (75 %, Z= 0.67)*   * Growth percentiles are based on CDC (Girls, 2-20 Years) data.    Physical Exam Vitals signs and nursing note reviewed.  Constitutional:      General: She is not in acute distress.    Appearance: Normal appearance. She is not ill-appearing, toxic-appearing or diaphoretic.  HENT:     Head: Normocephalic and atraumatic.     Right Ear: External ear normal.     Left Ear: External ear normal.     Nose:  Nose normal.     Mouth/Throat:     Mouth: Mucous membranes are moist.     Pharynx: Oropharynx is clear.  Eyes:     General: No scleral icterus.       Right eye: No discharge.        Left eye: No discharge.     Extraocular Movements: Extraocular movements intact.     Conjunctiva/sclera: Conjunctivae normal.     Pupils: Pupils are equal, round, and reactive to light.  Neck:     Musculoskeletal: Normal range of motion and neck supple.  Cardiovascular:     Rate and Rhythm: Normal rate and regular rhythm.     Pulses: Normal pulses.     Heart sounds: Normal heart sounds. No murmur. No friction rub. No gallop.   Pulmonary:     Effort: Pulmonary effort is normal. No respiratory distress.     Breath sounds: Normal breath sounds. No stridor. No wheezing, rhonchi or rales.  Chest:     Chest wall: No tenderness.  Musculoskeletal: Normal range of motion.  Skin:    General: Skin is warm and dry.     Capillary Refill: Capillary refill takes less than 2 seconds.     Coloration: Skin is not jaundiced or pale.     Findings: No  bruising, erythema, lesion or rash.  Neurological:     General: No focal deficit present.     Mental Status: She is alert and oriented to person, place, and time. Mental status is at baseline.  Psychiatric:        Mood and Affect: Mood normal.        Behavior: Behavior normal.        Thought Content: Thought content normal.        Judgment: Judgment normal.     Results for orders placed or performed in visit on 01/20/19  Novel Coronavirus, NAA ([facility])  Result Value Ref Range   SARS-CoV-2, NAA Not Detected Not Detected  Microscopic Examination   URINE  Result Value Ref Range   WBC, UA 0-5 0 - 5 /hpf   RBC 0-2 0 - 2 /hpf   Epithelial Cells (non renal) 0-10 0 - 10 /hpf   Crystals Present N/A   Crystal Type Calcium Oxalate N/A   Bacteria, UA Many (A) None seen/Few  Urine Culture, Reflex   URINE  Result Value Ref Range   Urine Culture, Routine Final report     Organism ID, Bacteria Comment   CBC with Differential/Platelet  Result Value Ref Range   WBC 5.5 3.4 - 10.8 x10E3/uL   RBC 4.74 3.77 - 5.28 x10E6/uL   Hemoglobin 14.7 11.1 - 15.9 g/dL   Hematocrit 56.343.9 87.534.0 - 46.6 %   MCV 93 79 - 97 fL   MCH 31.0 26.6 - 33.0 pg   MCHC 33.5 31.5 - 35.7 g/dL   RDW 64.312.2 32.911.7 - 51.815.4 %   Platelets 244 150 - 450 x10E3/uL   Neutrophils 49 Not Estab. %   Lymphs 42 Not Estab. %   Monocytes 6 Not Estab. %   Eos 2 Not Estab. %   Basos 1 Not Estab. %   Neutrophils Absolute 2.7 1.4 - 7.0 x10E3/uL   Lymphocytes Absolute 2.3 0.7 - 3.1 x10E3/uL   Monocytes Absolute 0.3 0.1 - 0.9 x10E3/uL   EOS (ABSOLUTE) 0.1 0.0 - 0.4 x10E3/uL   Basophils Absolute 0.0 0.0 - 0.2 x10E3/uL   Immature Granulocytes 0 Not Estab. %   Immature Grans (Abs) 0.0 0.0 - 0.1 x10E3/uL  Comprehensive metabolic panel  Result Value Ref Range   Glucose 87 65 - 99 mg/dL   BUN 10 6 - 20 mg/dL   Creatinine, Ser 8.411.00 0.57 - 1.00 mg/dL   GFR calc non Af Amer 81 >59 mL/min/1.73   GFR calc Af Amer 94 >59 mL/min/1.73   BUN/Creatinine Ratio 10 9 - 23   Sodium 140 134 - 144 mmol/L   Potassium 4.1 3.5 - 5.2 mmol/L   Chloride 103 96 - 106 mmol/L   CO2 21 20 - 29 mmol/L   Calcium 9.3 8.7 - 10.2 mg/dL   Total Protein 7.0 6.0 - 8.5 g/dL   Albumin 4.3 3.9 - 5.0 g/dL   Globulin, Total 2.7 1.5 - 4.5 g/dL   Albumin/Globulin Ratio 1.6 1.2 - 2.2   Bilirubin Total 1.2 0.0 - 1.2 mg/dL   Alkaline Phosphatase 74 39 - 117 IU/L   AST 19 0 - 40 IU/L   ALT 9 0 - 32 IU/L  Lipid Panel w/o Chol/HDL Ratio  Result Value Ref Range   Cholesterol, Total 177 100 - 199 mg/dL   Triglycerides 660119 0 - 149 mg/dL   HDL 47 >63>39 mg/dL   VLDL Cholesterol Cal 24 5 - 40 mg/dL   LDL Calculated 016106 (H)  0 - 99 mg/dL  TSH  Result Value Ref Range   TSH 2.470 0.450 - 4.500 uIU/mL  UA/M w/rflx Culture, Routine   Specimen: Urine   URINE  Result Value Ref Range   Specific Gravity, UA 1.025 1.005 - 1.030   pH, UA 6.0 5.0 - 7.5    Color, UA Yellow Yellow   Appearance Ur Hazy (A) Clear   Leukocytes,UA Negative Negative   Protein,UA Trace (A) Negative/Trace   Glucose, UA Negative Negative   Ketones, UA Trace (A) Negative   RBC, UA Trace (A) Negative   Bilirubin, UA Negative Negative   Urobilinogen, Ur 0.2 0.2 - 1.0 mg/dL   Nitrite, UA Negative Negative   Microscopic Examination See below:    Urinalysis Reflex Comment       Assessment & Plan:   Problem List Items Addressed This Visit    None    Visit Diagnoses    Encounter for other contraceptive management    -  Primary   Would like to change to implant or IUD- will look over information and let us know what she wants to do. We will refer to GYN for procedure. Call with concerns.   Flu vaccine need       Flu shot given today   Relevant Orders   Flu Vaccine QUAD 36+ mos IM (Completed)       Follow up plan: Return if symptoms worsen or fail to improve.

## 2019-06-30 ENCOUNTER — Encounter: Payer: Self-pay | Admitting: Unknown Physician Specialty

## 2019-06-30 ENCOUNTER — Ambulatory Visit: Payer: Managed Care, Other (non HMO) | Admitting: Unknown Physician Specialty

## 2019-06-30 ENCOUNTER — Other Ambulatory Visit: Payer: Self-pay

## 2019-06-30 VITALS — BP 120/69 | HR 103 | Temp 98.4°F

## 2019-06-30 DIAGNOSIS — I889 Nonspecific lymphadenitis, unspecified: Secondary | ICD-10-CM | POA: Diagnosis not present

## 2019-06-30 NOTE — Progress Notes (Signed)
BP 120/69 (BP Location: Left Arm, Patient Position: Sitting, Cuff Size: Normal)   Pulse (!) 103   Temp 98.4 F (36.9 C) (Oral)   SpO2 99%    Subjective:    Patient ID: Katie Bradford, female    DOB: 24-Jul-2000, 18 y.o.   MRN: [id number]  HPI: Katie Bradford is a 18 y.o. female  Chief Complaint  Patient presents with  . Mass    lumps in groin area. noticed yesterday. not painful.    Pt noticed non tender lumps left groin.  Does not know how long they had been there.  No pelvic pain, fever or groin pain.  No change in energy.  No new vaginal discharge.    Relevant past medical, surgical, family and social history reviewed and updated as indicated. Interim medical history since our last visit reviewed. Allergies and medications reviewed and updated.  Review of Systems  Per HPI unless specifically indicated above     Objective:    BP 120/69 (BP Location: Left Arm, Patient Position: Sitting, Cuff Size: Normal)   Pulse (!) 103   Temp 98.4 F (36.9 C) (Oral)   SpO2 99%   Wt Readings from Last 3 Encounters:  04/13/19 135 lb (61.2 kg)  01/20/19 139 lb (63 kg)  10/21/17 142 lb 9.6 oz (64.7 kg) (75 %, Z= 0.67)*   * Growth percentiles are based on CDC (Girls, 2-20 Years) data.    Physical Exam Constitutional:      General: She is not in acute distress.    Appearance: Normal appearance. She is well-developed.  HENT:     Head: Normocephalic and atraumatic.  Eyes:     General: Lids are normal. No scleral icterus.       Right eye: No discharge.        Left eye: No discharge.     Conjunctiva/sclera: Conjunctivae normal.  Cardiovascular:     Rate and Rhythm: Normal rate.  Pulmonary:     Effort: Pulmonary effort is normal.  Abdominal:     General: Abdomen is flat. There is no distension.     Palpations: Abdomen is soft. There is no hepatomegaly, splenomegaly or mass.     Tenderness: There is no abdominal tenderness.     Hernia: No hernia is present.  Musculoskeletal:          General: Normal range of motion.  Skin:    Coloration: Skin is not pale.     Findings: No rash.  Neurological:     Mental Status: She is alert and oriented to person, place, and time.  Psychiatric:        Behavior: Behavior normal.        Thought Content: Thought content normal.        Judgment: Judgment normal.    Left groin with shoddy lymphadenopathy.  2 lymph nodes more prominent than others, freely moveable.   Results for orders placed or performed in visit on 01/20/19  Novel Coronavirus, NAA ([facility])  Result Value Ref Range   SARS-CoV-2, NAA Not Detected Not Detected  Microscopic Examination   URINE  Result Value Ref Range   WBC, UA 0-5 0 - 5 /hpf   RBC 0-2 0 - 2 /hpf   Epithelial Cells (non renal) 0-10 0 - 10 /hpf   Crystals Present N/A   Crystal Type Calcium Oxalate N/A   Bacteria, UA Many (A) None seen/Few  Urine Culture, Reflex   URINE  Result Value Ref Range   Urine Culture,  Routine Final report    Organism ID, Bacteria Comment   CBC with Differential/Platelet  Result Value Ref Range   WBC 5.5 3.4 - 10.8 x10E3/uL   RBC 4.74 3.77 - 5.28 x10E6/uL   Hemoglobin 14.7 11.1 - 15.9 g/dL   Hematocrit 81.143.9 91.434.0 - 46.6 %   MCV 93 79 - 97 fL   MCH 31.0 26.6 - 33.0 pg   MCHC 33.5 31.5 - 35.7 g/dL   RDW 78.212.2 95.611.7 - 21.315.4 %   Platelets 244 150 - 450 x10E3/uL   Neutrophils 49 Not Estab. %   Lymphs 42 Not Estab. %   Monocytes 6 Not Estab. %   Eos 2 Not Estab. %   Basos 1 Not Estab. %   Neutrophils Absolute 2.7 1.4 - 7.0 x10E3/uL   Lymphocytes Absolute 2.3 0.7 - 3.1 x10E3/uL   Monocytes Absolute 0.3 0.1 - 0.9 x10E3/uL   EOS (ABSOLUTE) 0.1 0.0 - 0.4 x10E3/uL   Basophils Absolute 0.0 0.0 - 0.2 x10E3/uL   Immature Granulocytes 0 Not Estab. %   Immature Grans (Abs) 0.0 0.0 - 0.1 x10E3/uL  Comprehensive metabolic panel  Result Value Ref Range   Glucose 87 65 - 99 mg/dL   BUN 10 6 - 20 mg/dL   Creatinine, Ser 0.861.00 0.57 - 1.00 mg/dL   GFR calc non Af Amer 81 >59  mL/min/1.73   GFR calc Af Amer 94 >59 mL/min/1.73   BUN/Creatinine Ratio 10 9 - 23   Sodium 140 134 - 144 mmol/L   Potassium 4.1 3.5 - 5.2 mmol/L   Chloride 103 96 - 106 mmol/L   CO2 21 20 - 29 mmol/L   Calcium 9.3 8.7 - 10.2 mg/dL   Total Protein 7.0 6.0 - 8.5 g/dL   Albumin 4.3 3.9 - 5.0 g/dL   Globulin, Total 2.7 1.5 - 4.5 g/dL   Albumin/Globulin Ratio 1.6 1.2 - 2.2   Bilirubin Total 1.2 0.0 - 1.2 mg/dL   Alkaline Phosphatase 74 39 - 117 IU/L   AST 19 0 - 40 IU/L   ALT 9 0 - 32 IU/L  Lipid Panel w/o Chol/HDL Ratio  Result Value Ref Range   Cholesterol, Total 177 100 - 199 mg/dL   Triglycerides 578119 0 - 149 mg/dL   HDL 47 >46>39 mg/dL   VLDL Cholesterol Cal 24 5 - 40 mg/dL   LDL Calculated 962106 (H) 0 - 99 mg/dL  TSH  Result Value Ref Range   TSH 2.470 0.450 - 4.500 uIU/mL  UA/M w/rflx Culture, Routine   Specimen: Urine   URINE  Result Value Ref Range   Specific Gravity, UA 1.025 1.005 - 1.030   pH, UA 6.0 5.0 - 7.5   Color, UA Yellow Yellow   Appearance Ur Hazy (A) Clear   Leukocytes,UA Negative Negative   Protein,UA Trace (A) Negative/Trace   Glucose, UA Negative Negative   Ketones, UA Trace (A) Negative   RBC, UA Trace (A) Negative   Bilirubin, UA Negative Negative   Urobilinogen, Ur 0.2 0.2 - 1.0 mg/dL   Nitrite, UA Negative Negative   Microscopic Examination See below:    Urinalysis Reflex Comment       Assessment & Plan:   Problem List Items Addressed This Visit    None    Visit Diagnoses    Lymphadenitis    -  Primary   Left groin and appear benign.  Recheck in 2 weeks.         Follow up plan: Return  in about 2 weeks (around 07/14/2019).

## 2019-08-13 ENCOUNTER — Ambulatory Visit: Payer: Managed Care, Other (non HMO) | Admitting: Family Medicine

## 2019-10-17 ENCOUNTER — Other Ambulatory Visit: Payer: Self-pay | Admitting: Family Medicine

## 2019-10-17 NOTE — Telephone Encounter (Signed)
Duplicate request

## 2019-10-17 NOTE — Telephone Encounter (Signed)
Requested medication (s) are due for refill today: yes  Requested medication (s) are on the active medication list: no  Last refill:  12/11/18  Future visit scheduled: no  Notes to clinic:  Refill request under different name: previous refill was Junel Fe- please review   Requested Prescriptions  Pending Prescriptions Disp Refills   AUROVELA FE 1/20 1-20 MG-MCG tablet [Pharmacy Med Name: AUROVELA FE 1/20 TABLETS] 84 tablet 3    Sig: TAKE 1 TABLET BY MOUTH EVERY DAY      OB/GYN:  Contraceptives Passed - 10/17/2019  9:53 AM      Passed - Last BP in normal range    BP Readings from Last 1 Encounters:  06/30/19 120/69          Passed - Valid encounter within last 12 months    Recent Outpatient Visits           3 months ago Lymphadenitis   [facility] Gabriel Cirri, NP   6 months ago Encounter for other contraceptive management   [facility] [place], Megan P, DO   9 months ago Routine general medical examination at a health care facility   [facility], Megan P, DO   11 months ago Encounter for surveillance of contraceptive pills   [facility] [place], Oralia Rud, DO   1 year ago Annual physical exam   [facility] Particia Nearing, [place]

## 2019-10-19 NOTE — Telephone Encounter (Signed)
Pt returning call to PCP. She states that she has enough medication until her appt. Please advise.

## 2019-10-19 NOTE — Telephone Encounter (Signed)
LOV 06/30/19

## 2019-10-19 NOTE — Telephone Encounter (Signed)
Can we see if she has enough of her medicine to make it until Thursday?

## 2019-10-19 NOTE — Telephone Encounter (Signed)
Pt has an appt scheduled for Thursay.

## 2019-10-19 NOTE — Telephone Encounter (Signed)
Needs physical!

## 2019-10-19 NOTE — Telephone Encounter (Signed)
Routing to provider  

## 2019-10-19 NOTE — Telephone Encounter (Signed)
Called patient and LVM for patient to return call to the office. 

## 2019-10-22 ENCOUNTER — Encounter: Payer: Self-pay | Admitting: Family Medicine

## 2019-10-22 ENCOUNTER — Other Ambulatory Visit: Payer: Self-pay

## 2019-10-22 ENCOUNTER — Ambulatory Visit (INDEPENDENT_AMBULATORY_CARE_PROVIDER_SITE_OTHER): Payer: Managed Care, Other (non HMO) | Admitting: Family Medicine

## 2019-10-22 VITALS — BP 133/77 | HR 78 | Temp 98.1°F | Wt 143.0 lb

## 2019-10-22 DIAGNOSIS — Z3041 Encounter for surveillance of contraceptive pills: Secondary | ICD-10-CM | POA: Diagnosis not present

## 2019-10-22 MED ORDER — NORETHIN ACE-ETH ESTRAD-FE 1-20 MG-MCG PO TABS: 1.0000 | ORAL_TABLET | Freq: Every day | ORAL | 5 refills | Status: DC

## 2019-10-22 NOTE — Progress Notes (Signed)
BP 133/77 (BP Location: Left Arm, Patient Position: Sitting, Cuff Size: Normal)   Pulse 78   Temp 98.1 F (36.7 C) (Oral)   Wt 143 lb (64.9 kg)   SpO2 98%   BMI 24.39 kg/m    Subjective:    Patient ID: Katie Bradford, female    DOB: 2001-03-29, 19 y.o.   MRN: [id number]  HPI: Katie Bradford is a 19 y.o. female  Chief Complaint  Patient presents with  . contraception managment    Patient states she is happy with the birth control she is on now.   CONTRACEPTION CONCERNS Contraception: OCP Previous contraception: condoms  Sexual activity: practicing safe sex Gravida/Para: G0  Average interval between menses:  Length of menses:  Flow: Dysmenorrhea:  Relevant past medical, surgical, family and social history reviewed and updated as indicated. Interim medical history since our last visit reviewed. Allergies and medications reviewed and updated.  Review of Systems  Constitutional: Negative.   Respiratory: Negative.   Cardiovascular: Negative.   Gastrointestinal: Negative.   Musculoskeletal: Negative.   Neurological: Negative.   Psychiatric/Behavioral: Negative.     Per HPI unless specifically indicated above     Objective:    BP 133/77 (BP Location: Left Arm, Patient Position: Sitting, Cuff Size: Normal)   Pulse 78   Temp 98.1 F (36.7 C) (Oral)   Wt 143 lb (64.9 kg)   SpO2 98%   BMI 24.39 kg/m   Wt Readings from Last 3 Encounters:  10/22/19 143 lb (64.9 kg)  04/13/19 135 lb (61.2 kg)  01/20/19 139 lb (63 kg)    Physical Exam Vitals and nursing note reviewed.  Constitutional:      General: She is not in acute distress.    Appearance: Normal appearance. She is not ill-appearing, toxic-appearing or diaphoretic.  HENT:     Head: Normocephalic and atraumatic.     Right Ear: External ear normal.     Left Ear: External ear normal.     Nose: Nose normal.     Mouth/Throat:     Mouth: Mucous membranes are moist.     Pharynx: Oropharynx is clear.  Eyes:     General: No scleral icterus.       Right eye: No discharge.        Left eye: No discharge.     Extraocular Movements: Extraocular movements intact.     Conjunctiva/sclera: Conjunctivae normal.     Pupils: Pupils are equal, round, and reactive to light.  Cardiovascular:     Rate and Rhythm: Normal rate and regular rhythm.     Pulses: Normal pulses.     Heart sounds: Normal heart sounds. No murmur. No friction rub. No gallop.   Pulmonary:     Effort: Pulmonary effort is normal. No respiratory distress.     Breath sounds: Normal breath sounds. No stridor. No wheezing, rhonchi or rales.  Chest:     Chest wall: No tenderness.  Musculoskeletal:        General: Normal range of motion.     Cervical back: Normal range of motion and neck supple.  Skin:    General: Skin is warm and dry.     Capillary Refill: Capillary refill takes less than 2 seconds.     Coloration: Skin is not jaundiced or pale.     Findings: No bruising, erythema, lesion or rash.  Neurological:     General: No focal deficit present.     Mental Status: She is alert and oriented to  person, place, and time. Mental status is at baseline.  Psychiatric:        Mood and Affect: Mood normal.        Behavior: Behavior normal.        Thought Content: Thought content normal.        Judgment: Judgment normal.     Results for orders placed or performed in visit on 01/20/19  Novel Coronavirus, NAA ([facility])  Result Value Ref Range   SARS-CoV-2, NAA Not Detected Not Detected  Microscopic Examination   URINE  Result Value Ref Range   WBC, UA 0-5 0 - 5 /hpf   RBC 0-2 0 - 2 /hpf   Epithelial Cells (non renal) 0-10 0 - 10 /hpf   Crystals Present N/A   Crystal Type Calcium Oxalate N/A   Bacteria, UA Many (A) None seen/Few  Urine Culture, Reflex   URINE  Result Value Ref Range   Urine Culture, Routine Final report    Organism ID, Bacteria Comment   CBC with Differential/Platelet  Result Value Ref Range   WBC 5.5 3.4 - 10.8  x10E3/uL   RBC 4.74 3.77 - 5.28 x10E6/uL   Hemoglobin 14.7 11.1 - 15.9 g/dL   Hematocrit 43.9 34.0 - 46.6 %   MCV 93 79 - 97 fL   MCH 31.0 26.6 - 33.0 pg   MCHC 33.5 31.5 - 35.7 g/dL   RDW 12.2 11.7 - 15.4 %   Platelets 244 150 - 450 x10E3/uL   Neutrophils 49 Not Estab. %   Lymphs 42 Not Estab. %   Monocytes 6 Not Estab. %   Eos 2 Not Estab. %   Basos 1 Not Estab. %   Neutrophils Absolute 2.7 1.4 - 7.0 x10E3/uL   Lymphocytes Absolute 2.3 0.7 - 3.1 x10E3/uL   Monocytes Absolute 0.3 0.1 - 0.9 x10E3/uL   EOS (ABSOLUTE) 0.1 0.0 - 0.4 x10E3/uL   Basophils Absolute 0.0 0.0 - 0.2 x10E3/uL   Immature Granulocytes 0 Not Estab. %   Immature Grans (Abs) 0.0 0.0 - 0.1 x10E3/uL  Comprehensive metabolic panel  Result Value Ref Range   Glucose 87 65 - 99 mg/dL   BUN 10 6 - 20 mg/dL   Creatinine, Ser 1.00 0.57 - 1.00 mg/dL   GFR calc non Af Amer 81 >59 mL/min/1.73   GFR calc Af Amer 94 >59 mL/min/1.73   BUN/Creatinine Ratio 10 9 - 23   Sodium 140 134 - 144 mmol/L   Potassium 4.1 3.5 - 5.2 mmol/L   Chloride 103 96 - 106 mmol/L   CO2 21 20 - 29 mmol/L   Calcium 9.3 8.7 - 10.2 mg/dL   Total Protein 7.0 6.0 - 8.5 g/dL   Albumin 4.3 3.9 - 5.0 g/dL   Globulin, Total 2.7 1.5 - 4.5 g/dL   Albumin/Globulin Ratio 1.6 1.2 - 2.2   Bilirubin Total 1.2 0.0 - 1.2 mg/dL   Alkaline Phosphatase 74 39 - 117 IU/L   AST 19 0 - 40 IU/L   ALT 9 0 - 32 IU/L  Lipid Panel w/o Chol/HDL Ratio  Result Value Ref Range   Cholesterol, Total 177 100 - 199 mg/dL   Triglycerides 119 0 - 149 mg/dL   HDL 47 >39 mg/dL   VLDL Cholesterol Cal 24 5 - 40 mg/dL   LDL Calculated 106 (H) 0 - 99 mg/dL  TSH  Result Value Ref Range   TSH 2.470 0.450 - 4.500 uIU/mL  UA/M w/rflx Culture, Routine   Specimen: Urine  URINE  Result Value Ref Range   Specific Gravity, UA 1.025 1.005 - 1.030   pH, UA 6.0 5.0 - 7.5   Color, UA Yellow Yellow   Appearance Ur Hazy (A) Clear   Leukocytes,UA Negative Negative   Protein,UA Trace (A)  Negative/Trace   Glucose, UA Negative Negative   Ketones, UA Trace (A) Negative   RBC, UA Trace (A) Negative   Bilirubin, UA Negative Negative   Urobilinogen, Ur 0.2 0.2 - 1.0 mg/dL   Nitrite, UA Negative Negative   Microscopic Examination See below:    Urinalysis Reflex Comment       Assessment & Plan:   Problem List Items Addressed This Visit    None    Visit Diagnoses    Encounter for surveillance of contraceptive pills    -  Primary   Tolerating her medicine well. No concerns. Refills given today. To do her pap at her physical in July.       Follow up plan: Return July for Physical.

## 2019-11-09 ENCOUNTER — Other Ambulatory Visit: Payer: Self-pay | Admitting: Family Medicine

## 2020-01-28 ENCOUNTER — Encounter: Payer: Self-pay | Admitting: Family Medicine

## 2020-01-28 ENCOUNTER — Ambulatory Visit (INDEPENDENT_AMBULATORY_CARE_PROVIDER_SITE_OTHER): Payer: Managed Care, Other (non HMO) | Admitting: Family Medicine

## 2020-01-28 ENCOUNTER — Other Ambulatory Visit: Payer: Self-pay

## 2020-01-28 ENCOUNTER — Other Ambulatory Visit (HOSPITAL_COMMUNITY)
Admission: RE | Admit: 2020-01-28 | Discharge: 2020-01-28 | Disposition: A | Discharge status: Home / Self Care | Payer: Managed Care, Other (non HMO) | Source: Ambulatory Visit | Attending: Family Medicine | Admitting: Family Medicine

## 2020-01-28 VITALS — BP 124/87 | HR 82 | Temp 98.2°F | Ht 65.0 in | Wt 144.2 lb

## 2020-01-28 DIAGNOSIS — Z124 Encounter for screening for malignant neoplasm of cervix: Secondary | ICD-10-CM | POA: Insufficient documentation

## 2020-01-28 DIAGNOSIS — Z0000 Encounter for general adult medical examination without abnormal findings: Secondary | ICD-10-CM

## 2020-01-28 DIAGNOSIS — Z23 Encounter for immunization: Secondary | ICD-10-CM

## 2020-01-28 LAB — URINALYSIS, ROUTINE W REFLEX MICROSCOPIC
Bilirubin, UA: NEGATIVE
Glucose, UA: NEGATIVE
Ketones, UA: NEGATIVE
Leukocytes,UA: NEGATIVE
Nitrite, UA: NEGATIVE
Protein,UA: NEGATIVE
RBC, UA: NEGATIVE
Specific Gravity, UA: <1.005 — ABNORMAL LOW (ref 1.005–1.030)
Urobilinogen, Ur: 0.2 mg/dL (ref 0.2–1.0)
pH, UA: 6.5 (ref 5.0–7.5)

## 2020-01-28 NOTE — Progress Notes (Signed)
BP (!) 124/87 (BP Location: Left Arm, Patient Position: Sitting, Cuff Size: Normal)   Pulse 82   Temp 98.2 F (36.8 C) (Oral)   Ht 5\' 5"  (1.651 m)   Wt 144 lb 3.2 oz (65.4 kg)   LMP 12/20/2019 (Approximate)   SpO2 98%   BMI 24.00 kg/m    Subjective:    Patient ID: 12/[id number], female    DOB: 01-08-2001, 19 y.o.   MRN: [id number]  HPI: Katie Bradford is a 19 y.o. female presenting on 01/28/2020 for comprehensive medical examination. Current medical complaints include:none  Menopausal Symptoms: no  Depression Screen done today and results listed below:  Depression screen [facility] 2/9 01/28/2020 01/20/2019 10/21/2017 10/18/2016 05/06/2015  Decreased Interest 0 0 0 0 0  Down, Depressed, Hopeless 0 0 0 0 0  PHQ - 2 Score 0 0 0 0 0  Altered sleeping 0 0 0 - -  Tired, decreased energy 0 0 1 - -  Change in appetite 0 0 1 - -  Feeling bad or failure about yourself  0 0 1 - -  Trouble concentrating 0 0 0 - -  Moving slowly or fidgety/restless 0 0 0 - -  Suicidal thoughts 0 0 0 - -  PHQ-9 Score 0 0 3 - -  Difficult doing work/chores Not difficult at all Not difficult at all - - -    Past Medical History:  History reviewed. No pertinent past medical history.  Surgical History:  History reviewed. No pertinent surgical history.  Medications:  Current Outpatient Medications on File Prior to Visit  Medication Sig  . DORYX MPC 120 MG TBEC Take 1 tablet by mouth daily.  13/10/2014 EPIDUO FORTE 0.3-2.5 % GEL Apply A pea sized AMOUNT TO THE entire FACE EVERY NIGHT AT BEDTIME  . norethindrone-ethinyl estradiol (AUROVELA FE 1/20) 1-20 MG-MCG tablet Take 1 tablet by mouth daily.   No current facility-administered medications on file prior to visit.    Allergies:  No Known Allergies  Social History:  Social History   Socioeconomic History  . Marital status: Single    Spouse name: Not on file  . Number of children: Not on file  . Years of education: Not on file  . Highest education level: Not  on file  Occupational History  . Not on file  Tobacco Use  . Smoking status: Never Smoker  . Smokeless tobacco: Never Used  Vaping Use  . Vaping Use: Never used  Substance and Sexual Activity  . Alcohol use: Yes    Comment: socially  . Drug use: No  . Sexual activity: Yes  Other Topics Concern  . Not on file  Social History Narrative  . Not on file   Social Determinants of Health   Financial Resource Strain:   . Difficulty of Paying Living Expenses:   Food Insecurity:   . Worried About 2/20 in the Last Year:   . Programme researcher, broadcasting/film/video in the Last Year:   Transportation Needs:   . Barista (Medical):   Freight forwarder Lack of Transportation (Non-Medical):   Physical Activity:   . Days of Exercise per Week:   . Minutes of Exercise per Session:   Stress:   . Feeling of Stress :   Social Connections:   . Frequency of Communication with Friends and Family:   . Frequency of Social Gatherings with Friends and Family:   . Attends Religious Services:   . Active Member of Clubs or  Organizations:   . Attends Banker Meetings:   Marland Kitchen Marital Status:   Intimate Partner Violence:   . Fear of Current or Ex-Partner:   . Emotionally Abused:   Marland Kitchen Physically Abused:   . Sexually Abused:    Social History   Tobacco Use  Smoking Status Never Smoker  Smokeless Tobacco Never Used   Social History   Substance and Sexual Activity  Alcohol Use Yes   Comment: socially    Family History:  Family History  Problem Relation Age of Onset  . Heart disease Father   . Hypertension Father   . Cancer Maternal Grandfather        pancreatic  . Diabetes Paternal Aunt   . Diabetes Paternal Uncle   . Diabetes Paternal Grandmother   . Stroke Neg Hx   . COPD Neg Hx     Past medical history, surgical history, medications, allergies, family history and social history reviewed with patient today and changes made to appropriate areas of the chart.   Review of Systems    Constitutional: Negative.   HENT: Negative.   Eyes: Negative.   Respiratory: Negative.   Cardiovascular: Negative.   Gastrointestinal: Negative.   Genitourinary: Negative.   Musculoskeletal: Negative.   Skin: Negative.   Neurological: Negative.   Endo/Heme/Allergies: Negative.   Psychiatric/Behavioral: Negative.     All other ROS negative except what is listed above and in the HPI.      Objective:    BP (!) 124/87 (BP Location: Left Arm, Patient Position: Sitting, Cuff Size: Normal)   Pulse 82   Temp 98.2 F (36.8 C) (Oral)   Ht  (1.651 m)   Wt 144 lb 3.2 oz (65.4 kg)   LMP 12/20/2019 (Approximate)   SpO2 98%   BMI 24.00 kg/m   Wt Readings from Last 3 Encounters:  01/28/20 144 lb 3.2 oz (65.4 kg)  10/22/19 143 lb (64.9 kg)  04/13/19 135 lb (61.2 kg)    Physical Exam Vitals and nursing note reviewed. Exam conducted with a chaperone present.  Constitutional:      General: She is not in acute distress.    Appearance: Normal appearance. She is not ill-appearing, toxic-appearing or diaphoretic.  HENT:     Head: Normocephalic and atraumatic.     Right Ear: Tympanic membrane, ear canal and external ear normal. There is no impacted cerumen.     Left Ear: Tympanic membrane, ear canal and external ear normal. There is no impacted cerumen.     Nose: Nose normal. No congestion or rhinorrhea.     Mouth/Throat:     Mouth: Mucous membranes are moist.     Pharynx: Oropharynx is clear. No oropharyngeal exudate or posterior oropharyngeal erythema.  Eyes:     General: No scleral icterus.       Right eye: No discharge.        Left eye: No discharge.     Extraocular Movements: Extraocular movements intact.     Conjunctiva/sclera: Conjunctivae normal.     Pupils: Pupils are equal, round, and reactive to light.  Neck:     Vascular: No carotid bruit.  Cardiovascular:     Rate and Rhythm: Normal rate and regular rhythm.     Pulses: Normal pulses.     Heart sounds: No murmur  heard.  No friction rub. No gallop.   Pulmonary:     Effort: Pulmonary effort is normal. No respiratory distress.     Breath sounds: Normal breath sounds.  No stridor. No wheezing, rhonchi or rales.  Chest:     Chest wall: No tenderness.     Breasts:        Right: Normal. No swelling, bleeding, inverted nipple, mass, nipple discharge, skin change or tenderness.        Left: Normal. No swelling, bleeding, inverted nipple, mass, nipple discharge, skin change or tenderness.  Abdominal:     General: Abdomen is flat. Bowel sounds are normal. There is no distension.     Palpations: Abdomen is soft. There is no mass.     Tenderness: There is no abdominal tenderness. There is no right CVA tenderness, left CVA tenderness, guarding or rebound.     Hernia: No hernia is present.  Genitourinary:    Pubic Area: No rash.      Labia:        Right: No rash, tenderness, lesion or injury.        Left: No rash, tenderness, lesion or injury.      Urethra: No prolapse, urethral pain, urethral swelling or urethral lesion.     Vagina: Normal.     Cervix: Normal.     Uterus: Normal.      Adnexa: Right adnexa normal.  Musculoskeletal:        General: No swelling, tenderness, deformity or signs of injury.     Cervical back: Normal range of motion and neck supple. No rigidity. No muscular tenderness.     Right lower leg: No edema.     Left lower leg: No edema.  Lymphadenopathy:     Cervical: No cervical adenopathy.  Skin:    General: Skin is warm and dry.     Capillary Refill: Capillary refill takes less than 2 seconds.     Coloration: Skin is not jaundiced or pale.     Findings: No bruising, erythema, lesion or rash.  Neurological:     General: No focal deficit present.     Mental Status: She is alert and oriented to person, place, and time. Mental status is at baseline.     Cranial Nerves: No cranial nerve deficit.     Sensory: No sensory deficit.     Motor: No weakness.     Coordination:  Coordination normal.     Gait: Gait normal.     Deep Tendon Reflexes: Reflexes normal.  Psychiatric:        Mood and Affect: Mood normal.        Behavior: Behavior normal.        Thought Content: Thought content normal.        Judgment: Judgment normal.     Results for orders placed or performed in visit on 01/20/19  Novel Coronavirus, NAA ([facility])  Result Value Ref Range   SARS-CoV-2, NAA Not Detected Not Detected  Microscopic Examination   URINE  Result Value Ref Range   WBC, UA 0-5 0 - 5 /hpf   RBC 0-2 0 - 2 /hpf   Epithelial Cells (non renal) 0-10 0 - 10 /hpf   Crystals Present N/A   Crystal Type Calcium Oxalate N/A   Bacteria, UA Many (A) None seen/Few  Urine Culture, Reflex   URINE  Result Value Ref Range   Urine Culture, Routine Final report    Organism ID, Bacteria Comment   CBC with Differential/Platelet  Result Value Ref Range   WBC 5.5 3.4 - 10.8 x10E3/uL   RBC 4.74 3.77 - 5.28 x10E6/uL   Hemoglobin 14.7 11.1 - 15.9 g/dL  Hematocrit 43.9 34.0 - 46.6 %   MCV 93 79 - 97 fL   MCH 31.0 26.6 - 33.0 pg   MCHC 33.5 31 - 35 g/dL   RDW 46.2 70.3 - 50.0 %   Platelets 244 150 - 450 x10E3/uL   Neutrophils 49 Not Estab. %   Lymphs 42 Not Estab. %   Monocytes 6 Not Estab. %   Eos 2 Not Estab. %   Basos 1 Not Estab. %   Neutrophils Absolute 2.7 1 - 7 x10E3/uL   Lymphocytes Absolute 2.3 0 - 3 x10E3/uL   Monocytes Absolute 0.3 0 - 0 x10E3/uL   EOS (ABSOLUTE) 0.1 0.0 - 0.4 x10E3/uL   Basophils Absolute 0.0 0 - 0 x10E3/uL   Immature Granulocytes 0 Not Estab. %   Immature Grans (Abs) 0.0 0.0 - 0.1 x10E3/uL  Comprehensive metabolic panel  Result Value Ref Range   Glucose 87 65 - 99 mg/dL   BUN 10 6 - 20 mg/dL   Creatinine, Ser 9.38 0.57 - 1.00 mg/dL   GFR calc non Af Amer 81 >59 mL/min/1.73   GFR calc Af Amer 94 >59 mL/min/1.73   BUN/Creatinine Ratio 10 9 - 23   Sodium 140 134 - 144 mmol/L   Potassium 4.1 3.5 - 5.2 mmol/L   Chloride 103 96 - 106 mmol/L   CO2 21  20 - 29 mmol/L   Calcium 9.3 8.7 - 10.2 mg/dL   Total Protein 7.0 6.0 - 8.5 g/dL   Albumin 4.3 3.9 - 5.0 g/dL   Globulin, Total 2.7 1.5 - 4.5 g/dL   Albumin/Globulin Ratio 1.6 1.2 - 2.2   Bilirubin Total 1.2 0.0 - 1.2 mg/dL   Alkaline Phosphatase 74 39 - 117 IU/L   AST 19 0 - 40 IU/L   ALT 9 0 - 32 IU/L  Lipid Panel w/o Chol/HDL Ratio  Result Value Ref Range   Cholesterol, Total 177 100 - 199 mg/dL   Triglycerides 182 0 - 149 mg/dL   HDL 47 >99 mg/dL   VLDL Cholesterol Cal 24 5 - 40 mg/dL   LDL Calculated 371 (H) 0 - 99 mg/dL  TSH  Result Value Ref Range   TSH 2.470 0.450 - 4.500 uIU/mL  UA/M w/rflx Culture, Routine   Specimen: Urine   URINE  Result Value Ref Range   Specific Gravity, UA 1.025 1.005 - 1.030   pH, UA 6.0 5.0 - 7.5   Color, UA Yellow Yellow   Appearance Ur Hazy (A) Clear   Leukocytes,UA Negative Negative   Protein,UA Trace (A) Negative/Trace   Glucose, UA Negative Negative   Ketones, UA Trace (A) Negative   RBC, UA Trace (A) Negative   Bilirubin, UA Negative Negative   Urobilinogen, Ur 0.2 0.2 - 1.0 mg/dL   Nitrite, UA Negative Negative   Microscopic Examination See below:    Urinalysis Reflex Comment       Assessment & Plan:   Problem List Items Addressed This Visit    None    Visit Diagnoses    Routine general medical examination at a health care facility    -  Primary   Vaccines updated. Screening labs checked today. Pap done. Continue diet and exercise. Call with any concerns.    Relevant Orders   TSH   Urinalysis, Routine w reflex microscopic   Lipid Panel w/o Chol/HDL Ratio   CBC with Differential/Platelet   Comprehensive metabolic panel   Screening for cervical cancer  Pap done today. Await results.    Relevant Orders   Cytology - PAP   Need for diphtheria-tetanus-pertussis (Tdap) vaccine       Tdap given today.   Relevant Orders   Tdap vaccine greater than or equal to 7yo IM (Completed)       Follow up plan: Return in about  1 year (around 01/27/2021) for Physical.   LABORATORY TESTING:  - Pap smear: pap done  IMMUNIZATIONS:   - Tdap: Tetanus vaccination status reviewed: Tdap vaccination indicated and given today. - Influenza: Postponed to flu season - Pneumovax: Not applicable - COVID: Up to date  PATIENT COUNSELING:   Advised to take 1 mg of folate supplement per day if capable of pregnancy.   Sexuality: Discussed sexually transmitted diseases, partner selection, use of condoms, avoidance of unintended pregnancy  and contraceptive alternatives.   Advised to avoid cigarette smoking.  I discussed with the patient that most people either abstain from alcohol or drink within safe limits (<=14/week and <=4 drinks/occasion for males, <=7/weeks and <= 3 drinks/occasion for females) and that the risk for alcohol disorders and other health effects rises proportionally with the number of drinks per week and how often a drinker exceeds daily limits.  Discussed cessation/primary prevention of drug use and availability of treatment for abuse.   Diet: Encouraged to adjust caloric intake to maintain  or achieve ideal body weight, to reduce intake of dietary saturated fat and total fat, to limit sodium intake by avoiding high sodium foods and not adding table salt, and to maintain adequate dietary potassium and calcium preferably from fresh fruits, vegetables, and low-fat dairy products.    stressed the importance of regular exercise  Injury prevention: Discussed safety belts, safety helmets, smoke detector, smoking near bedding or upholstery.   Dental health: Discussed importance of regular tooth brushing, flossing, and dental visits.    NEXT PREVENTATIVE PHYSICAL DUE IN 1 YEAR. Return in about 1 year (around 01/27/2021) for Physical.

## 2020-01-28 NOTE — Patient Instructions (Signed)
Health Maintenance, Female Adopting a healthy lifestyle and getting preventive care are important in promoting health and wellness. Ask your health care provider about:  The right schedule for you to have regular tests and exams.  Things you can do on your own to prevent diseases and keep yourself healthy. What should I know about diet, weight, and exercise? Eat a healthy diet   Eat a diet that includes plenty of vegetables, fruits, low-fat dairy products, and lean protein.  Do not eat a lot of foods that are high in solid fats, added sugars, or sodium. Maintain a healthy weight Body mass index (BMI) is used to identify weight problems. It estimates body fat based on height and weight. Your health care provider can help determine your BMI and help you achieve or maintain a healthy weight. Get regular exercise Get regular exercise. This is one of the most important things you can do for your health. Most adults should:  Exercise for at least 150 minutes each week. The exercise should increase your heart rate and make you sweat (moderate-intensity exercise).  Do strengthening exercises at least twice a week. This is in addition to the moderate-intensity exercise.  Spend less time sitting. Even light physical activity can be beneficial. Watch cholesterol and blood lipids Have your blood tested for lipids and cholesterol at 20 years of age, then have this test every 5 years. Have your cholesterol levels checked more often if:  Your lipid or cholesterol levels are high.  You are older than 21 years of age.  You are at high risk for heart disease. What should I know about cancer screening? Depending on your health history and family history, you may need to have cancer screening at various ages. This may include screening for:  Breast cancer.  Cervical cancer.  Colorectal cancer.  Skin cancer.  Lung cancer. What should I know about heart disease, diabetes, and high blood  pressure? Blood pressure and heart disease  High blood pressure causes heart disease and increases the risk of stroke. This is more likely to develop in people who have high blood pressure readings, are of African descent, or are overweight.  Have your blood pressure checked: ? Every 3-5 years if you are 18-39 years of age. ? Every year if you are 40 years old or older. Diabetes Have regular diabetes screenings. This checks your fasting blood sugar level. Have the screening done:  Once every three years after age 40 if you are at a normal weight and have a low risk for diabetes.  More often and at a younger age if you are overweight or have a high risk for diabetes. What should I know about preventing infection? Hepatitis B If you have a higher risk for hepatitis B, you should be screened for this virus. Talk with your health care provider to find out if you are at risk for hepatitis B infection. Hepatitis C Testing is recommended for:  Everyone born from 1945 through 1965.  Anyone with known risk factors for hepatitis C. Sexually transmitted infections (STIs)  Get screened for STIs, including gonorrhea and chlamydia, if: ? You are sexually active and are younger than 21 years of age. ? You are older than 21 years of age and your health care provider tells you that you are at risk for this type of infection. ? Your sexual activity has changed since you were last screened, and you are at increased risk for chlamydia or gonorrhea. Ask your health care provider if   you are at risk.  Ask your health care provider about whether you are at high risk for HIV. Your health care provider may recommend a prescription medicine to help prevent HIV infection. If you choose to take medicine to prevent HIV, you should first get tested for HIV. You should then be tested every 3 months for as long as you are taking the medicine. Pregnancy  If you are about to stop having your period (premenopausal) and  you may become pregnant, seek counseling before you get pregnant.  Take 400 to 800 micrograms (mcg) of folic acid every day if you become pregnant.  Ask for birth control (contraception) if you want to prevent pregnancy. Osteoporosis and menopause Osteoporosis is a disease in which the bones lose minerals and strength with aging. This can result in bone fractures. If you are 65 years old or older, or if you are at risk for osteoporosis and fractures, ask your health care provider if you should:  Be screened for bone loss.  Take a calcium or vitamin D supplement to lower your risk of fractures.  Be given hormone replacement therapy (HRT) to treat symptoms of menopause. Follow these instructions at home: Lifestyle  Do not use any products that contain nicotine or tobacco, such as cigarettes, e-cigarettes, and chewing tobacco. If you need help quitting, ask your health care provider.  Do not use street drugs.  Do not share needles.  Ask your health care provider for help if you need support or information about quitting drugs. Alcohol use  Do not drink alcohol if: ? Your health care provider tells you not to drink. ? You are pregnant, may be pregnant, or are planning to become pregnant.  If you drink alcohol: ? Limit how much you use to 0-1 drink a day. ? Limit intake if you are breastfeeding.  Be aware of how much alcohol is in your drink. In the [place] one drink equals one 12 oz bottle of beer (355 mL), one 5 oz glass of wine (148 mL), or one 1 oz glass of hard liquor (44 mL). General instructions  Schedule regular health, dental, and eye exams.  Stay current with your vaccines.  Tell your health care provider if: ? You often feel depressed. ? You have ever been abused or do not feel safe at home. Summary  Adopting a healthy lifestyle and getting preventive care are important in promoting health and wellness.  Follow your health care provider's instructions about healthy  diet, exercising, and getting tested or screened for diseases.  Follow your health care provider's instructions on monitoring your cholesterol and blood pressure. This information is not intended to replace advice given to you by your health care provider. Make sure you discuss any questions you have with your health care provider. Document Revised: 06/11/2018 Document Reviewed: 06/11/2018 Elsevier Patient Education  2020 Elsevier Inc.  https://www.cdc.gov/vaccines/hcp/vis/vis-statements/tdap.pdf">  Tdap (Tetanus, Diphtheria, Pertussis) Vaccine: What You Need to Know 1. Why get vaccinated? Tdap vaccine can prevent tetanus, diphtheria, and pertussis. Diphtheria and pertussis spread from person to person. Tetanus enters the body through cuts or wounds.  TETANUS (T) causes painful stiffening of the muscles. Tetanus can lead to serious health problems, including being unable to open the mouth, having trouble swallowing and breathing, or death.  DIPHTHERIA (D) can lead to difficulty breathing, heart failure, paralysis, or death.  PERTUSSIS (aP), also known as "whooping cough," can cause uncontrollable, violent coughing which makes it hard to breathe, eat, or drink. Pertussis can be   extremely serious in babies and young children, causing pneumonia, convulsions, brain damage, or death. In teens and adults, it can cause weight loss, loss of bladder control, passing out, and rib fractures from severe coughing. 2. Tdap vaccine Tdap is only for children 7 years and older, adolescents, and adults.  Adolescents should receive a single dose of Tdap, preferably at age 11 or 12 years. Pregnant women should get a dose of Tdap during every pregnancy, to protect the newborn from pertussis. Infants are most at risk for severe, life-threatening complications from pertussis. Adults who have never received Tdap should get a dose of Tdap. Also, adults should receive a booster dose every 10 years, or earlier in the  case of a severe and dirty wound or burn. Booster doses can be either Tdap or Td (a different vaccine that protects against tetanus and diphtheria but not pertussis). Tdap may be given at the same time as other vaccines. 3. Talk with your health care provider Tell your vaccine provider if the person getting the vaccine:  Has had an allergic reaction after a previous dose of any vaccine that protects against tetanus, diphtheria, or pertussis, or has any severe, life-threatening allergies.  Has had a coma, decreased level of consciousness, or prolonged seizures within 7 days after a previous dose of any pertussis vaccine (DTP, DTaP, or Tdap).  Has seizures or another nervous system problem.  Has ever had Guillain-Barr Syndrome (also called GBS).  Has had severe pain or swelling after a previous dose of any vaccine that protects against tetanus or diphtheria. In some cases, your health care provider may decide to postpone Tdap vaccination to a future visit.  People with minor illnesses, such as a cold, may be vaccinated. People who are moderately or severely ill should usually wait until they recover before getting Tdap vaccine.  Your health care provider can give you more information. 4. Risks of a vaccine reaction  Pain, redness, or swelling where the shot was given, mild fever, headache, feeling tired, and nausea, vomiting, diarrhea, or stomachache sometimes happen after Tdap vaccine. People sometimes faint after medical procedures, including vaccination. Tell your provider if you feel dizzy or have vision changes or ringing in the ears.  As with any medicine, there is a very remote chance of a vaccine causing a severe allergic reaction, other serious injury, or death. 5. What if there is a serious problem? An allergic reaction could occur after the vaccinated person leaves the clinic. If you see signs of a severe allergic reaction (hives, swelling of the face and throat, difficulty  breathing, a fast heartbeat, dizziness, or weakness), call 9-1-1 and get the person to the nearest hospital. For other signs that concern you, call your health care provider.  Adverse reactions should be reported to the Vaccine Adverse Event Reporting System (VAERS). Your health care provider will usually file this report, or you can do it yourself. Visit the VAERS website at www.vaers.hhs.gov or call 1-800-822-7967. VAERS is only for reporting reactions, and VAERS staff do not give medical advice. 6. The National Vaccine Injury Compensation Program The National Vaccine Injury Compensation Program (VICP) is a federal program that was created to compensate people who may have been injured by certain vaccines. Visit the VICP website at www.hrsa.gov/vaccinecompensation or call 1-800-338-2382 to learn about the program and about filing a claim. There is a time limit to file a claim for compensation. 7. How can I learn more?  Ask your health care provider.  Call your local   or state health department.  Contact the Centers for Disease Control and Prevention (CDC): ? Call 1-800-232-4636 (1-800-CDC-INFO) or ? Visit CDC's website at www.cdc.gov/vaccines Vaccine Information Statement Tdap (Tetanus, Diphtheria, Pertussis) Vaccine (10/01/2018) This information is not intended to replace advice given to you by your health care provider. Make sure you discuss any questions you have with your health care provider. Document Revised: 10/10/2018 Document Reviewed: 10/13/2018 Elsevier Patient Education  2020 Elsevier Inc.  

## 2020-01-29 ENCOUNTER — Encounter: Payer: Self-pay | Admitting: Family Medicine

## 2020-01-29 LAB — CBC WITH DIFFERENTIAL/PLATELET
Basophils Absolute: 0.1 10*3/uL (ref 0.0–0.2)
Basos: 1 %
EOS (ABSOLUTE): 0.1 10*3/uL (ref 0.0–0.4)
Eos: 2 %
Hematocrit: 41.6 % (ref 34.0–46.6)
Hemoglobin: 14.2 g/dL (ref 11.1–15.9)
Immature Grans (Abs): 0 10*3/uL (ref 0.0–0.1)
Immature Granulocytes: 0 %
Lymphocytes Absolute: 2.5 10*3/uL (ref 0.7–3.1)
Lymphs: 39 %
MCH: 31.1 pg (ref 26.6–33.0)
MCHC: 34.1 g/dL (ref 31.5–35.7)
MCV: 91 fL (ref 79–97)
Monocytes Absolute: 0.5 10*3/uL (ref 0.1–0.9)
Monocytes: 8 %
Neutrophils Absolute: 3.3 10*3/uL (ref 1.4–7.0)
Neutrophils: 50 %
Platelets: 280 10*3/uL (ref 150–450)
RBC: 4.57 x10E6/uL (ref 3.77–5.28)
RDW: 11.8 % (ref 11.7–15.4)
WBC: 6.5 10*3/uL (ref 3.4–10.8)

## 2020-01-29 LAB — COMPREHENSIVE METABOLIC PANEL
ALT: 6 IU/L (ref 0–32)
AST: 19 IU/L (ref 0–40)
Albumin/Globulin Ratio: 1.5 (ref 1.2–2.2)
Albumin: 4.3 g/dL (ref 3.9–5.0)
Alkaline Phosphatase: 62 IU/L (ref 48–121)
BUN/Creatinine Ratio: 10 (ref 9–23)
BUN: 9 mg/dL (ref 6–20)
Bilirubin Total: 0.9 mg/dL (ref 0.0–1.2)
CO2: 23 mmol/L (ref 20–29)
Calcium: 9.7 mg/dL (ref 8.7–10.2)
Chloride: 103 mmol/L (ref 96–106)
Creatinine, Ser: 0.94 mg/dL (ref 0.57–1.00)
GFR calc Af Amer: 100 mL/min/{1.73_m2} (ref >59)
GFR calc non Af Amer: 87 mL/min/{1.73_m2} (ref >59)
Globulin, Total: 2.9 g/dL (ref 1.5–4.5)
Glucose: 94 mg/dL (ref 65–99)
Potassium: 3.7 mmol/L (ref 3.5–5.2)
Sodium: 139 mmol/L (ref 134–144)
Total Protein: 7.2 g/dL (ref 6.0–8.5)

## 2020-01-29 LAB — LIPID PANEL W/O CHOL/HDL RATIO
Cholesterol, Total: 175 mg/dL (ref 100–199)
HDL: 53 mg/dL (ref >39)
LDL Chol Calc (NIH): 105 mg/dL — ABNORMAL HIGH (ref 0–99)
Triglycerides: 90 mg/dL (ref 0–149)
VLDL Cholesterol Cal: 17 mg/dL (ref 5–40)

## 2020-01-29 LAB — TSH: TSH: 2.1 u[IU]/mL (ref 0.450–4.500)

## 2020-02-02 LAB — CYTOLOGY - PAP
Adequacy: ABSENT
Diagnosis: NEGATIVE

## 2020-12-28 ENCOUNTER — Other Ambulatory Visit: Payer: Self-pay | Admitting: Family Medicine

## 2020-12-28 NOTE — Telephone Encounter (Signed)
Lvm to make this med refill apt. 

## 2020-12-28 NOTE — Telephone Encounter (Signed)
Requested medication (s) are due for refill today: yes   Requested medication (s) are on the active medication list: yes   Last refill:  10/05/2020  Future visit scheduled:no   Notes to clinic:  last physical was 01/28/2020 Review for 90 day     Requested Prescriptions  Pending Prescriptions Disp Refills   norethindrone-ethinyl estradiol-FE (LOESTRIN FE) 1-20 MG-MCG tablet [Pharmacy Med Name: NORETHINDRONE/ETH FE 1/20TABS 28S] 84 tablet     Sig: Take 1 tablet by mouth daily.      OB/GYN:  Contraceptives Passed - 12/28/2020 10:44 AM      Passed - Last BP in normal range    BP Readings from Last 1 Encounters:  01/28/20 (!) 124/87          Passed - Valid encounter within last 12 months    Recent Outpatient Visits           11 months ago Routine general medical examination at a health care facility   [facility] [place], [place] P, DO   1 year ago Encounter for surveillance of contraceptive pills   [facility] [place], [place], DO   1 year ago Lymphadenitis   [facility] Gabriel Cirri, NP   1 year ago Encounter for other contraceptive management   [facility] Dorcas Carrow, DO   1 year ago Routine general medical examination at a health care facility   [facility], [place] P, DO

## 2021-01-10 NOTE — Telephone Encounter (Signed)
LVM TO MAKE THIS APT  

## 2021-01-11 ENCOUNTER — Encounter: Payer: Self-pay | Admitting: Family Medicine

## 2021-01-11 NOTE — Telephone Encounter (Signed)
FYI

## 2021-01-11 NOTE — Telephone Encounter (Signed)
LVM TO MAKE THIS APT.SENT LETTER 

## 2022-11-22 ENCOUNTER — Encounter: Payer: Self-pay | Admitting: Family Medicine

## 2022-11-23 NOTE — Telephone Encounter (Signed)
appt

## 2022-11-27 NOTE — Telephone Encounter (Signed)
Called and scheduled appointment for 11/29/2022 @ 9:20 am.

## 2022-11-29 ENCOUNTER — Encounter: Payer: Self-pay | Admitting: Family Medicine

## 2022-11-29 ENCOUNTER — Ambulatory Visit: Payer: Managed Care, Other (non HMO) | Admitting: Family Medicine

## 2022-11-29 VITALS — BP 106/73 | HR 72 | Temp 98.7°F | Ht 65.0 in | Wt 137.8 lb

## 2022-11-29 DIAGNOSIS — Z13220 Encounter for screening for lipoid disorders: Secondary | ICD-10-CM | POA: Diagnosis not present

## 2022-11-29 DIAGNOSIS — Z7289 Other problems related to lifestyle: Secondary | ICD-10-CM

## 2022-11-29 DIAGNOSIS — F322 Major depressive disorder, single episode, severe without psychotic features: Secondary | ICD-10-CM

## 2022-11-29 LAB — URINALYSIS, ROUTINE W REFLEX MICROSCOPIC
Bilirubin, UA: NEGATIVE
Glucose, UA: NEGATIVE
Ketones, UA: NEGATIVE
Leukocytes,UA: NEGATIVE
Nitrite, UA: NEGATIVE
Protein,UA: NEGATIVE
RBC, UA: NEGATIVE
Specific Gravity, UA: 1.015 (ref 1.005–1.030)
Urobilinogen, Ur: 0.2 mg/dL (ref 0.2–1.0)
pH, UA: 7 (ref 5.0–7.5)

## 2022-11-29 MED ORDER — FLUOXETINE HCL 10 MG PO TABS: ORAL_TABLET | ORAL | 3 refills | Status: DC

## 2022-11-29 NOTE — Assessment & Plan Note (Addendum)
List of counselors given today. We will check labs to look for organic cause. Will start her on prozac. Recheck 3-4 weeks. Call with any concerns. Continue to monitor.

## 2022-11-29 NOTE — Progress Notes (Signed)
BP 106/73   Pulse 72   Temp 98.7 F (37.1 C) (Oral)   Ht 5\' 5"  (1.651 m)   Wt 137 lb 12.8 oz (62.5 kg)   SpO2 99%   BMI 22.93 kg/m    Subjective:    Patient ID: Katie Bradford, female    DOB: [birth year], 22 y.o.   MRN: [id number]  HPI: Katie Bradford is a 22 y.o. female  Chief Complaint  Patient presents with   Mental Health Problem    Patient says she think she has been having issues, but it has gotten to the point where it is effecting her every day life. Patient would like to discuss a possible referral to Psychiatry at today's visit. Patient has a family history of Depression.     ANXIETY/DEPRESSION Duration: Chronic -thinks it has been going on for years, but has been getting significantly worse recently. Status: uncontrolled Anxious mood: yes  Excessive worrying: yes Irritability: yes  Sweating: no Nausea: no Palpitations:no Hyperventilation: no Panic attacks: no Agoraphobia: no  Obscessions/compulsions: no Depressed mood: yes    11/29/2022    9:10 AM 01/28/2020    2:38 PM 01/20/2019    8:24 AM 10/21/2017    1:55 PM 10/18/2016    1:20 PM  Depression screen PHQ 2/9  Decreased Interest 2 0 0 0 0  Down, Depressed, Hopeless 2 0 0 0 0  PHQ - 2 Score 4 0 0 0 0  Altered sleeping 0 0 0 0   Tired, decreased energy 3 0 0 1   Change in appetite 1 0 0 1   Feeling bad or failure about yourself  3 0 0 1   Trouble concentrating 1 0 0 0   Moving slowly or fidgety/restless 2 0 0 0   Suicidal thoughts 3 0 0 0   PHQ-9 Score 17 0 0 3   Difficult doing work/chores Very difficult Not difficult at all Not difficult at all        11/29/2022    9:10 AM 01/20/2019    8:24 AM  GAD 7 : Generalized Anxiety Score  Nervous, Anxious, on Edge 3 0  Control/stop worrying 3 0  Worry too much - different things 3 0  Trouble relaxing 2 0  Restless 2 0  Easily annoyed or irritable 2 0  Afraid - awful might happen 0 0  Total GAD 7 Score 15 0  Anxiety Difficulty  Not difficult at all    Anhedonia: yes Weight changes: no Insomnia: no   Hypersomnia: no Fatigue/loss of energy: yes Feelings of worthlessness: yes Feelings of guilt: yes Impaired concentration/indecisiveness: yes Suicidal ideations: no  Crying spells: no Recent Stressors/Life Changes: no   Relationship problems: no   Family stress: no     Financial stress: no    Job stress: no    Recent death/loss: no  Relevant past medical, surgical, family and social history reviewed and updated as indicated. Interim medical history since our last visit reviewed. Allergies and medications reviewed and updated.  Review of Systems  Constitutional: Negative.   Respiratory: Negative.    Cardiovascular: Negative.   Gastrointestinal: Negative.   Musculoskeletal: Negative.   Psychiatric/Behavioral:  Positive for confusion, dysphoric mood and self-injury. Negative for agitation, behavioral problems, decreased concentration, hallucinations, sleep disturbance and suicidal ideas. The patient is nervous/anxious. The patient is not hyperactive.     Per HPI unless specifically indicated above     Objective:    BP 106/73   Pulse 72  Temp 98.7 F (37.1 C) (Oral)   Ht 5\' 5"  (1.651 m)   Wt 137 lb 12.8 oz (62.5 kg)   SpO2 99%   BMI 22.93 kg/m   Wt Readings from Last 3 Encounters:  11/29/22 137 lb 12.8 oz (62.5 kg)  01/28/20 144 lb 3.2 oz (65.4 kg)  10/22/19 143 lb (64.9 kg)    Physical Exam Vitals and nursing note reviewed.  Constitutional:      General: She is not in acute distress.    Appearance: Normal appearance. She is normal weight. She is not ill-appearing, toxic-appearing or diaphoretic.  HENT:     Head: Normocephalic and atraumatic.     Right Ear: External ear normal.     Left Ear: External ear normal.     Nose: Nose normal.     Mouth/Throat:     Mouth: Mucous membranes are moist.     Pharynx: Oropharynx is clear.  Eyes:     General: No scleral icterus.       Right eye: No discharge.        Left  eye: No discharge.     Extraocular Movements: Extraocular movements intact.     Conjunctiva/sclera: Conjunctivae normal.     Pupils: Pupils are equal, round, and reactive to light.  Cardiovascular:     Rate and Rhythm: Normal rate and regular rhythm.     Pulses: Normal pulses.     Heart sounds: Normal heart sounds. No murmur heard.    No friction rub. No gallop.  Pulmonary:     Effort: Pulmonary effort is normal. No respiratory distress.     Breath sounds: Normal breath sounds. No stridor. No wheezing, rhonchi or rales.  Chest:     Chest wall: No tenderness.  Musculoskeletal:        General: Normal range of motion.     Cervical back: Normal range of motion and neck supple.  Skin:    General: Skin is warm and dry.     Capillary Refill: Capillary refill takes less than 2 seconds.     Coloration: Skin is not jaundiced or pale.     Findings: No bruising, erythema, lesion or rash.  Neurological:     General: No focal deficit present.     Mental Status: She is alert and oriented to person, place, and time. Mental status is at baseline.  Psychiatric:        Mood and Affect: Mood normal.        Behavior: Behavior normal.        Thought Content: Thought content normal.        Judgment: Judgment normal.     Results for orders placed or performed in visit on 01/28/20  TSH  Result Value Ref Range   TSH 2.100 0.450 - 4.500 uIU/mL  Urinalysis, Routine w reflex microscopic  Result Value Ref Range   Specific Gravity, UA <1.005 (L) 1.005 - 1.030   pH, UA 6.5 5.0 - 7.5   Color, UA Yellow Yellow   Appearance Ur Clear Clear   Leukocytes,UA Negative Negative   Protein,UA Negative Negative/Trace   Glucose, UA Negative Negative   Ketones, UA Negative Negative   RBC, UA Negative Negative   Bilirubin, UA Negative Negative   Urobilinogen, Ur 0.2 0.2 - 1.0 mg/dL   Nitrite, UA Negative Negative  Lipid Panel w/o Chol/HDL Ratio  Result Value Ref Range   Cholesterol, Total 175 100 - 199 mg/dL    Triglycerides 90 0 - 149 mg/dL  HDL 53 >39 mg/dL   VLDL Cholesterol Cal 17 5 - 40 mg/dL   LDL Chol Calc (NIH) 254 (H) 0 - 99 mg/dL  CBC with Differential/Platelet  Result Value Ref Range   WBC 6.5 3.4 - 10.8 x10E3/uL   RBC 4.57 3.77 - 5.28 x10E6/uL   Hemoglobin 14.2 11.1 - 15.9 g/dL   Hematocrit 27.0 62.3 - 46.6 %   MCV 91 79 - 97 fL   MCH 31.1 26.6 - 33.0 pg   MCHC 34.1 31.5 - 35.7 g/dL   RDW 76.2 83.1 - 51.7 %   Platelets 280 150 - 450 x10E3/uL   Neutrophils 50 Not Estab. %   Lymphs 39 Not Estab. %   Monocytes 8 Not Estab. %   Eos 2 Not Estab. %   Basos 1 Not Estab. %   Neutrophils Absolute 3.3 1.4 - 7.0 x10E3/uL   Lymphocytes Absolute 2.5 0.7 - 3.1 x10E3/uL   Monocytes Absolute 0.5 0.1 - 0.9 x10E3/uL   EOS (ABSOLUTE) 0.1 0.0 - 0.4 x10E3/uL   Basophils Absolute 0.1 0.0 - 0.2 x10E3/uL   Immature Granulocytes 0 Not Estab. %   Immature Grans (Abs) 0.0 0.0 - 0.1 x10E3/uL  Comprehensive metabolic panel  Result Value Ref Range   Glucose 94 65 - 99 mg/dL   BUN 9 6 - 20 mg/dL   Creatinine, Ser 6.16 0.57 - 1.00 mg/dL   GFR calc non Af Amer 87 >59 mL/min/1.73   GFR calc Af Amer 100 >59 mL/min/1.73   BUN/Creatinine Ratio 10 9 - 23   Sodium 139 134 - 144 mmol/L   Potassium 3.7 3.5 - 5.2 mmol/L   Chloride 103 96 - 106 mmol/L   CO2 23 20 - 29 mmol/L   Calcium 9.7 8.7 - 10.2 mg/dL   Total Protein 7.2 6.0 - 8.5 g/dL   Albumin 4.3 3.9 - 5.0 g/dL   Globulin, Total 2.9 1.5 - 4.5 g/dL   Albumin/Globulin Ratio 1.5 1.2 - 2.2   Bilirubin Total 0.9 0.0 - 1.2 mg/dL   Alkaline Phosphatase 62 48 - 121 IU/L   AST 19 0 - 40 IU/L   ALT 6 0 - 32 IU/L  Cytology - PAP  Result Value Ref Range   Adequacy      Satisfactory for evaluation; transformation zone component ABSENT.   Diagnosis      - Negative for intraepithelial lesion or malignancy (NILM)      Assessment & Plan:   Problem List Items Addressed This Visit       Musculoskeletal and Integument   Deliberate self-cutting    Has  not cut in over a month, but does have a history.         Other   Depression, major, single episode, severe (HCC) - Primary    List of counselors given today. We will check labs to look for organic cause. Will start her on prozac. Recheck 3-4 weeks. Call with any concerns. Continue to monitor.       Relevant Medications   FLUoxetine (PROZAC) 10 MG tablet   Other Relevant Orders   CBC with Differential/Platelet   Comprehensive metabolic panel   Urinalysis, Routine w reflex microscopic   TSH   Other Visit Diagnoses     Screening for cholesterol level       Labs drawn today. Await results.   Relevant Orders   Lipid Panel w/o Chol/HDL Ratio        Follow up plan: Return 3-4 weeks, virtual visit  OK.

## 2022-11-29 NOTE — Assessment & Plan Note (Signed)
Has not cut in over a month, but does have a history.

## 2022-11-30 LAB — COMPREHENSIVE METABOLIC PANEL
ALT: 42 IU/L — ABNORMAL HIGH (ref 0–32)
AST: 103 IU/L — ABNORMAL HIGH (ref 0–40)
Albumin/Globulin Ratio: 1.7 (ref 1.2–2.2)
Albumin: 4.6 g/dL (ref 4.0–5.0)
Alkaline Phosphatase: 94 IU/L (ref 44–121)
BUN/Creatinine Ratio: 16 (ref 9–23)
BUN: 16 mg/dL (ref 6–20)
Bilirubin Total: 1 mg/dL (ref 0.0–1.2)
CO2: 21 mmol/L (ref 20–29)
Calcium: 9.5 mg/dL (ref 8.7–10.2)
Chloride: 103 mmol/L (ref 96–106)
Creatinine, Ser: 0.99 mg/dL (ref 0.57–1.00)
Globulin, Total: 2.7 g/dL (ref 1.5–4.5)
Glucose: 91 mg/dL (ref 70–99)
Potassium: 4.2 mmol/L (ref 3.5–5.2)
Sodium: 139 mmol/L (ref 134–144)
Total Protein: 7.3 g/dL (ref 6.0–8.5)
eGFR: 82 mL/min/{1.73_m2} (ref >59)

## 2022-11-30 LAB — TSH: TSH: 1.52 u[IU]/mL (ref 0.450–4.500)

## 2022-11-30 LAB — CBC WITH DIFFERENTIAL/PLATELET
Basophils Absolute: 0 10*3/uL (ref 0.0–0.2)
Basos: 1 %
EOS (ABSOLUTE): 0.2 10*3/uL (ref 0.0–0.4)
Eos: 3 %
Hematocrit: 43.7 % (ref 34.0–46.6)
Hemoglobin: 14.8 g/dL (ref 11.1–15.9)
Immature Grans (Abs): 0 10*3/uL (ref 0.0–0.1)
Immature Granulocytes: 0 %
Lymphocytes Absolute: 2.3 10*3/uL (ref 0.7–3.1)
Lymphs: 42 %
MCH: 31.2 pg (ref 26.6–33.0)
MCHC: 33.9 g/dL (ref 31.5–35.7)
MCV: 92 fL (ref 79–97)
Monocytes Absolute: 0.3 10*3/uL (ref 0.1–0.9)
Monocytes: 6 %
Neutrophils Absolute: 2.7 10*3/uL (ref 1.4–7.0)
Neutrophils: 48 %
Platelets: 264 10*3/uL (ref 150–450)
RBC: 4.75 x10E6/uL (ref 3.77–5.28)
RDW: 12.3 % (ref 11.7–15.4)
WBC: 5.5 10*3/uL (ref 3.4–10.8)

## 2022-11-30 LAB — LIPID PANEL W/O CHOL/HDL RATIO
Cholesterol, Total: 151 mg/dL (ref 100–199)
HDL: 57 mg/dL (ref >39)
LDL Chol Calc (NIH): 82 mg/dL (ref 0–99)
Triglycerides: 59 mg/dL (ref 0–149)
VLDL Cholesterol Cal: 12 mg/dL (ref 5–40)

## 2022-12-27 ENCOUNTER — Telehealth: Payer: Managed Care, Other (non HMO) | Admitting: Family Medicine

## 2023-02-13 ENCOUNTER — Encounter: Payer: Self-pay | Admitting: Family Medicine

## 2023-02-13 ENCOUNTER — Telehealth (INDEPENDENT_AMBULATORY_CARE_PROVIDER_SITE_OTHER): Payer: Managed Care, Other (non HMO) | Admitting: Family Medicine

## 2023-02-13 DIAGNOSIS — F322 Major depressive disorder, single episode, severe without psychotic features: Secondary | ICD-10-CM

## 2023-02-13 MED ORDER — FLUOXETINE HCL 20 MG PO TABS: 20.0000 mg | ORAL_TABLET | Freq: Every day | ORAL | 3 refills | Status: DC

## 2023-02-13 NOTE — Assessment & Plan Note (Signed)
Improved, but not there. Will increase her prozac to 20mg  and recheck in 4-6 weeks. Call with any concerns.

## 2023-02-13 NOTE — Progress Notes (Signed)
LMP 01/31/2023 (Approximate)    Subjective:    Patient ID: Katie Bradford, female    DOB: February 05, 2001, 22 y.o.   MRN: [id number]  HPI: Katie Bradford is a 22 y.o. female  Chief Complaint  Patient presents with   Depression    Pt states things are okay   DEPRESSION- after about a month or so, felt like she was feeling the effects, low points weren't feeling as low. She started noticing that she was feeling blunted. She now feels like it's not working.  Mood status: better, but not good Satisfied with current treatment?: no Symptom severity: moderate  Duration of current treatment :  a couple of months Side effects: a little blunted on the meds Medication compliance: excellent compliance Psychotherapy/counseling: yes  Previous psychiatric medications: prozac Depressed mood: yes Anxious mood: yes Anhedonia: yes Significant weight loss or gain: no Insomnia: no  Fatigue: yes Feelings of worthlessness or guilt: yes Impaired concentration/indecisiveness: no Suicidal ideations: no Hopelessness: no Crying spells: no    02/13/2023   10:52 AM 11/29/2022    9:10 AM 01/28/2020    2:38 PM 01/20/2019    8:24 AM 10/21/2017    1:55 PM  Depression screen PHQ 2/9  Decreased Interest 1 2 0 0 0  Down, Depressed, Hopeless 1 2 0 0 0  PHQ - 2 Score 2 4 0 0 0  Altered sleeping 1 0 0 0 0  Tired, decreased energy 1 3 0 0 1  Change in appetite 0 1 0 0 1  Feeling bad or failure about yourself  2 3 0 0 1  Trouble concentrating 2 1 0 0 0  Moving slowly or fidgety/restless 0 2 0 0 0  Suicidal thoughts 2 3 0 0 0  PHQ-9 Score 10 17 0 0 3  Difficult doing work/chores Somewhat difficult Very difficult Not difficult at all Not difficult at all       02/13/2023   10:54 AM 11/29/2022    9:10 AM 01/20/2019    8:24 AM  GAD 7 : Generalized Anxiety Score  Nervous, Anxious, on Edge 1 3 0  Control/stop worrying 2 3 0  Worry too much - different things 2 3 0  Trouble relaxing 0 2 0  Restless 2 2 0   Easily annoyed or irritable 0 2 0  Afraid - awful might happen 0 0 0  Total GAD 7 Score 7 15 0  Anxiety Difficulty Somewhat difficult  Not difficult at all      Relevant past medical, surgical, family and social history reviewed and updated as indicated. Interim medical history since our last visit reviewed. Allergies and medications reviewed and updated.  Review of Systems  Constitutional: Negative.   Respiratory: Negative.    Cardiovascular: Negative.   Gastrointestinal: Negative.   Musculoskeletal: Negative.   Psychiatric/Behavioral:  Positive for dysphoric mood. Negative for agitation, behavioral problems, confusion, decreased concentration, hallucinations, self-injury, sleep disturbance and suicidal ideas. The patient is nervous/anxious. The patient is not hyperactive.     Per HPI unless specifically indicated above     Objective:    LMP 01/31/2023 (Approximate)   Wt Readings from Last 3 Encounters:  11/29/22 137 lb 12.8 oz (62.5 kg)  01/28/20 144 lb 3.2 oz (65.4 kg)  10/22/19 143 lb (64.9 kg)    Physical Exam Vitals and nursing note reviewed.  Constitutional:      General: She is not in acute distress.    Appearance: Normal appearance. She is normal weight. She  is not ill-appearing, toxic-appearing or diaphoretic.  HENT:     Head: Normocephalic and atraumatic.     Right Ear: External ear normal.     Left Ear: External ear normal.     Nose: Nose normal.     Mouth/Throat:     Mouth: Mucous membranes are moist.     Pharynx: Oropharynx is clear.  Eyes:     General: No scleral icterus.       Right eye: No discharge.        Left eye: No discharge.     Conjunctiva/sclera: Conjunctivae normal.     Pupils: Pupils are equal, round, and reactive to light.  Pulmonary:     Effort: Pulmonary effort is normal. No respiratory distress.     Comments: Speaking in full sentences Musculoskeletal:        General: Normal range of motion.     Cervical back: Normal range of  motion.  Skin:    Coloration: Skin is not jaundiced or pale.     Findings: No bruising, erythema, lesion or rash.  Neurological:     Mental Status: She is alert and oriented to person, place, and time. Mental status is at baseline.  Psychiatric:        Mood and Affect: Mood normal.        Behavior: Behavior normal.        Thought Content: Thought content normal.        Judgment: Judgment normal.     Results for orders placed or performed in visit on 11/29/22  CBC with Differential/Platelet  Result Value Ref Range   WBC 5.5 3.4 - 10.8 x10E3/uL   RBC 4.75 3.77 - 5.28 x10E6/uL   Hemoglobin 14.8 11.1 - 15.9 g/dL   Hematocrit 40.9 81.1 - 46.6 %   MCV 92 79 - 97 fL   MCH 31.2 26.6 - 33.0 pg   MCHC 33.9 31.5 - 35.7 g/dL   RDW 91.4 78.2 - 95.6 %   Platelets 264 150 - 450 x10E3/uL   Neutrophils 48 Not Estab. %   Lymphs 42 Not Estab. %   Monocytes 6 Not Estab. %   Eos 3 Not Estab. %   Basos 1 Not Estab. %   Neutrophils Absolute 2.7 1.4 - 7.0 x10E3/uL   Lymphocytes Absolute 2.3 0.7 - 3.1 x10E3/uL   Monocytes Absolute 0.3 0.1 - 0.9 x10E3/uL   EOS (ABSOLUTE) 0.2 0.0 - 0.4 x10E3/uL   Basophils Absolute 0.0 0.0 - 0.2 x10E3/uL   Immature Granulocytes 0 Not Estab. %   Immature Grans (Abs) 0.0 0.0 - 0.1 x10E3/uL  Comprehensive metabolic panel  Result Value Ref Range   Glucose 91 70 - 99 mg/dL   BUN 16 6 - 20 mg/dL   Creatinine, Ser 2.13 0.57 - 1.00 mg/dL   eGFR 82 >08 MV/HQI/6.96   BUN/Creatinine Ratio 16 9 - 23   Sodium 139 134 - 144 mmol/L   Potassium 4.2 3.5 - 5.2 mmol/L   Chloride 103 96 - 106 mmol/L   CO2 21 20 - 29 mmol/L   Calcium 9.5 8.7 - 10.2 mg/dL   Total Protein 7.3 6.0 - 8.5 g/dL   Albumin 4.6 4.0 - 5.0 g/dL   Globulin, Total 2.7 1.5 - 4.5 g/dL   Albumin/Globulin Ratio 1.7 1.2 - 2.2   Bilirubin Total 1.0 0.0 - 1.2 mg/dL   Alkaline Phosphatase 94 44 - 121 IU/L   AST 103 (H) 0 - 40 IU/L   ALT 42 (H)  0 - 32 IU/L  Lipid Panel w/o Chol/HDL Ratio  Result Value Ref  Range   Cholesterol, Total 151 100 - 199 mg/dL   Triglycerides 59 0 - 149 mg/dL   HDL 57 >32 mg/dL   VLDL Cholesterol Cal 12 5 - 40 mg/dL   LDL Chol Calc (NIH) 82 0 - 99 mg/dL  Urinalysis, Routine w reflex microscopic  Result Value Ref Range   Specific Gravity, UA 1.015 1.005 - 1.030   pH, UA 7.0 5.0 - 7.5   Color, UA Yellow Yellow   Appearance Ur Clear Clear   Leukocytes,UA Negative Negative   Protein,UA Negative Negative/Trace   Glucose, UA Negative Negative   Ketones, UA Negative Negative   RBC, UA Negative Negative   Bilirubin, UA Negative Negative   Urobilinogen, Ur 0.2 0.2 - 1.0 mg/dL   Nitrite, UA Negative Negative   Microscopic Examination Comment   TSH  Result Value Ref Range   TSH 1.520 0.450 - 4.500 uIU/mL      Assessment & Plan:   Problem List Items Addressed This Visit       Other   Depression, major, single episode, severe (HCC) - Primary    Improved, but not there. Will increase her prozac to 20mg  and recheck in 4-6 weeks. Call with any concerns.       Relevant Medications   FLUoxetine (PROZAC) 20 MG tablet     Follow up plan: Return 4-6 weeks, for virtual OK.    This visit was completed via video visit through MyChart due to the restrictions of the COVID-19 pandemic. All issues as above were discussed and addressed. Physical exam was done as above through visual confirmation on video through MyChart. If it was felt that the patient should be evaluated in the office, they were directed there. The patient verbally consented to this visit. Location of the patient: work Location of the provider: work Those involved with this call:  Provider: Olevia Perches, DO CMA:  Maggie Font, CMA Front Desk/Registration: Servando Snare  Time spent on call:  15 minutes with patient face to face via video conference. More than 50% of this time was spent in counseling and coordination of care. 23 minutes total spent in review of patient's record and preparation of their  chart.

## 2023-10-02 ENCOUNTER — Telehealth (INDEPENDENT_AMBULATORY_CARE_PROVIDER_SITE_OTHER): Admitting: Family Medicine

## 2023-10-02 VITALS — HR 73 | Ht 65.0 in | Wt 130.0 lb

## 2023-10-02 DIAGNOSIS — F322 Major depressive disorder, single episode, severe without psychotic features: Secondary | ICD-10-CM | POA: Diagnosis not present

## 2023-10-02 MED ORDER — FLUOXETINE HCL 20 MG PO TABS: 20.0000 mg | ORAL_TABLET | Freq: Every day | ORAL | 1 refills | Status: DC

## 2023-10-02 NOTE — Assessment & Plan Note (Signed)
 Significantly better. Continue prozac. Call with any concerns. Refills given.

## 2023-10-02 NOTE — Progress Notes (Signed)
 Pulse 73   Ht 5\' 5"  (1.651 m)   Wt 130 lb (59 kg)   BMI 21.63 kg/m    Subjective:    Patient ID: Katie Bradford, female    DOB: 08-Mar-2001, 23 y.o.   MRN: [id number]  HPI: Katie Bradford is a 23 y.o. female  Chief Complaint  Patient presents with   Depression    Patient states that she feel like her medication is working and effective. She would like a medication refill.    DEPRESSION Mood status: better Satisfied with current treatment?: yes Symptom severity: mild  Duration of current treatment : chronic Side effects: no Medication compliance: good compliance Psychotherapy/counseling: no  Previous psychiatric medications: prozac Depressed mood: yes Anxious mood: yes Anhedonia: no Significant weight loss or gain: no Insomnia: no  Fatigue: yes Feelings of worthlessness or guilt: no Impaired concentration/indecisiveness: no Suicidal ideations: no Hopelessness: no Crying spells: no    02/13/2023   10:52 AM 11/29/2022    9:10 AM 01/28/2020    2:38 PM 01/20/2019    8:24 AM 10/21/2017    1:55 PM  Depression screen PHQ 2/9  Decreased Interest 1 2 0 0 0  Down, Depressed, Hopeless 1 2 0 0 0  PHQ - 2 Score 2 4 0 0 0  Altered sleeping 1 0 0 0 0  Tired, decreased energy 1 3 0 0 1  Change in appetite 0 1 0 0 1  Feeling bad or failure about yourself  2 3 0 0 1  Trouble concentrating 2 1 0 0 0  Moving slowly or fidgety/restless 0 2 0 0 0  Suicidal thoughts 2 3 0 0 0  PHQ-9 Score 10 17 0 0 3  Difficult doing work/chores Somewhat difficult Very difficult Not difficult at all Not difficult at all     Relevant past medical, surgical, family and social history reviewed and updated as indicated. Interim medical history since our last visit reviewed. Allergies and medications reviewed and updated.  Review of Systems  Constitutional: Negative.   Respiratory: Negative.    Musculoskeletal: Negative.   Neurological: Negative.   Psychiatric/Behavioral: Negative.      Per HPI  unless specifically indicated above     Objective:    Pulse 73   Ht 5\' 5"  (1.651 m)   Wt 130 lb (59 kg)   BMI 21.63 kg/m   Wt Readings from Last 3 Encounters:  10/02/23 130 lb (59 kg)  11/29/22 137 lb 12.8 oz (62.5 kg)  01/28/20 144 lb 3.2 oz (65.4 kg)    Physical Exam Vitals and nursing note reviewed.  Constitutional:      General: She is not in acute distress.    Appearance: Normal appearance. She is not ill-appearing, toxic-appearing or diaphoretic.  HENT:     Head: Normocephalic and atraumatic.     Right Ear: External ear normal.     Left Ear: External ear normal.     Nose: Nose normal.     Mouth/Throat:     Mouth: Mucous membranes are moist.     Pharynx: Oropharynx is clear.  Eyes:     General: No scleral icterus.       Right eye: No discharge.        Left eye: No discharge.     Conjunctiva/sclera: Conjunctivae normal.     Pupils: Pupils are equal, round, and reactive to light.  Pulmonary:     Effort: Pulmonary effort is normal. No respiratory distress.     Comments:  Speaking in full sentences Musculoskeletal:        General: Normal range of motion.     Cervical back: Normal range of motion.  Skin:    Coloration: Skin is not jaundiced or pale.     Findings: No bruising, erythema, lesion or rash.  Neurological:     Mental Status: She is alert and oriented to person, place, and time. Mental status is at baseline.  Psychiatric:        Mood and Affect: Mood normal.        Behavior: Behavior normal.        Thought Content: Thought content normal.        Judgment: Judgment normal.     Results for orders placed or performed in visit on 11/29/22  Urinalysis, Routine w reflex microscopic   Collection Time: 11/29/22  9:50 AM  Result Value Ref Range   Specific Gravity, UA 1.015 1.005 - 1.030   pH, UA 7.0 5.0 - 7.5   Color, UA Yellow Yellow   Appearance Ur Clear Clear   Leukocytes,UA Negative Negative   Protein,UA Negative Negative/Trace   Glucose, UA Negative  Negative   Ketones, UA Negative Negative   RBC, UA Negative Negative   Bilirubin, UA Negative Negative   Urobilinogen, Ur 0.2 0.2 - 1.0 mg/dL   Nitrite, UA Negative Negative   Microscopic Examination Comment   CBC with Differential/Platelet   Collection Time: 11/29/22  9:54 AM  Result Value Ref Range   WBC 5.5 3.4 - 10.8 x10E3/uL   RBC 4.75 3.77 - 5.28 x10E6/uL   Hemoglobin 14.8 11.1 - 15.9 g/dL   Hematocrit 19.1 47.8 - 46.6 %   MCV 92 79 - 97 fL   MCH 31.2 26.6 - 33.0 pg   MCHC 33.9 31.5 - 35.7 g/dL   RDW 29.5 62.1 - 30.8 %   Platelets 264 150 - 450 x10E3/uL   Neutrophils 48 Not Estab. %   Lymphs 42 Not Estab. %   Monocytes 6 Not Estab. %   Eos 3 Not Estab. %   Basos 1 Not Estab. %   Neutrophils Absolute 2.7 1.4 - 7.0 x10E3/uL   Lymphocytes Absolute 2.3 0.7 - 3.1 x10E3/uL   Monocytes Absolute 0.3 0.1 - 0.9 x10E3/uL   EOS (ABSOLUTE) 0.2 0.0 - 0.4 x10E3/uL   Basophils Absolute 0.0 0.0 - 0.2 x10E3/uL   Immature Granulocytes 0 Not Estab. %   Immature Grans (Abs) 0.0 0.0 - 0.1 x10E3/uL  Comprehensive metabolic panel   Collection Time: 11/29/22  9:54 AM  Result Value Ref Range   Glucose 91 70 - 99 mg/dL   BUN 16 6 - 20 mg/dL   Creatinine, Ser 6.57 0.57 - 1.00 mg/dL   eGFR 82 >84 ON/GEX/5.28   BUN/Creatinine Ratio 16 9 - 23   Sodium 139 134 - 144 mmol/L   Potassium 4.2 3.5 - 5.2 mmol/L   Chloride 103 96 - 106 mmol/L   CO2 21 20 - 29 mmol/L   Calcium 9.5 8.7 - 10.2 mg/dL   Total Protein 7.3 6.0 - 8.5 g/dL   Albumin 4.6 4.0 - 5.0 g/dL   Globulin, Total 2.7 1.5 - 4.5 g/dL   Albumin/Globulin Ratio 1.7 1.2 - 2.2   Bilirubin Total 1.0 0.0 - 1.2 mg/dL   Alkaline Phosphatase 94 44 - 121 IU/L   AST 103 (H) 0 - 40 IU/L   ALT 42 (H) 0 - 32 IU/L  Lipid Panel w/o Chol/HDL Ratio   Collection Time:  11/29/22  9:54 AM  Result Value Ref Range   Cholesterol, Total 151 100 - 199 mg/dL   Triglycerides 59 0 - 149 mg/dL   HDL 57 >11 mg/dL   VLDL Cholesterol Cal 12 5 - 40 mg/dL   LDL  Chol Calc (NIH) 82 0 - 99 mg/dL  TSH   Collection Time: 11/29/22  9:54 AM  Result Value Ref Range   TSH 1.520 0.450 - 4.500 uIU/mL      Assessment & Plan:   Problem List Items Addressed This Visit       Other   Depression, major, single episode, severe (HCC) - Primary   Significantly better. Continue prozac. Call with any concerns. Refills given.       Relevant Medications   FLUoxetine (PROZAC) 20 MG tablet     Follow up plan: Return When home for physical.    This visit was completed via video visit through MyChart due to the restrictions of the COVID-19 pandemic. All issues as above were discussed and addressed. Physical exam was done as above through visual confirmation on video through MyChart. If it was felt that the patient should be evaluated in the office, they were directed there. The patient verbally consented to this visit. Location of the patient: home Location of the provider: work Those involved with this call:  Provider: Olevia Perches, DO CMA: Docia Barrier, CMA, Front Desk/Registration: Servando Snare  Time spent on call:  15 minutes with patient face to face via video conference. More than 50% of this time was spent in counseling and coordination of care. 23 minutes total spent in review of patient's record and preparation of their chart.

## 2023-10-02 NOTE — Progress Notes (Signed)
 Message sent to patient

## 2023-10-07 ENCOUNTER — Other Ambulatory Visit: Payer: Self-pay | Admitting: Family Medicine

## 2023-10-08 NOTE — Telephone Encounter (Signed)
 Refilled 10/02/23 # 90 with 1 refills. Requested Prescriptions  Refused Prescriptions Disp Refills   FLUoxetine (PROZAC) 20 MG tablet [Pharmacy Med Name: FLUOXETINE 20MG  TABLETS] 30 tablet     Sig: TAKE 1 TABLET(20 MG) BY MOUTH DAILY     Psychiatry:  Antidepressants - SSRI Failed - 10/08/2023  1:45 PM      Failed - Valid encounter within last 6 months    Recent Outpatient Visits           6 days ago Depression, major, single episode, severe (HCC)   [facility] Bedias, Megan P, DO              Passed - Completed PHQ-2 or PHQ-9 in the last 360 days

## 2024-02-05 ENCOUNTER — Other Ambulatory Visit: Payer: Self-pay | Admitting: Family Medicine

## 2024-02-06 NOTE — Telephone Encounter (Signed)
 Requested Prescriptions  Refused Prescriptions Disp Refills   FLUoxetine  (PROZAC ) 10 MG tablet [Pharmacy Med Name: FLUOXETINE  10MG  TABLETS] 90 tablet 3    Sig: TAKE ONE-HALF TABLET BY MOUTH DAILY FOR 1 WEEK, THEN INCREASE TO 1 TABLET DAILY     Psychiatry:  Antidepressants - SSRI Passed - 02/06/2024  4:25 PM      Passed - Completed PHQ-2 or PHQ-9 in the last 360 days      Passed - Valid encounter within last 6 months    Recent Outpatient Visits           4 months ago Depression, major, single episode, severe [facility])   [facility] Hamer, Megan P, DO

## 2024-02-10 ENCOUNTER — Other Ambulatory Visit: Payer: Self-pay | Admitting: Family Medicine

## 2024-02-10 NOTE — Telephone Encounter (Signed)
 Copied from CRM #[id number]. Topic: Clinical - Medication Refill >> Feb 10, 2024  8:37 AM Avram MATSU wrote: Medication: FLUoxetine  (PROZAC ) 20 MG tablet [[id number]]  Has the patient contacted their pharmacy? Yes (Agent: If no, request that the patient contact the pharmacy for the refill. If patient does not wish to contact the pharmacy document the reason why and proceed with request.) (Agent: If yes, when and what did the pharmacy advise?)  This is the patient's preferred pharmacy:  [id number] [place], [place] - [address] AT [facility] OF [address] [place] [address] [place] [address] Phone: [phone] Fax: [phone]   Is this the correct pharmacy for this prescription? Yes If no, delete pharmacy and type the correct one.   Has the prescription been filled recently? No  Is the patient out of the medication? Yes wont have enough for 10/7 appt  Has the patient been seen for an appointment in the last year OR does the patient have an upcoming appointment? Yes  Can we respond through MyChart? Yes  Agent: Please be advised that Rx refills may take up to 3 business days. We ask that you follow-up with your pharmacy.

## 2024-02-13 NOTE — Telephone Encounter (Signed)
 Requested Prescriptions  Refused Prescriptions Disp Refills   FLUoxetine  (PROZAC ) 20 MG tablet 90 tablet 1    Sig: Take 1 tablet (20 mg total) by mouth daily.     Psychiatry:  Antidepressants - SSRI Failed - 02/13/2024 10:24 AM      Failed - Completed PHQ-2 or PHQ-9 in the last 360 days      Passed - Valid encounter within last 6 months    Recent Outpatient Visits           4 months ago Depression, major, single episode, severe [facility])   [facility] [place], Megan P, DO

## 2024-02-19 ENCOUNTER — Other Ambulatory Visit (HOSPITAL_COMMUNITY)
Admission: RE | Admit: 2024-02-19 | Discharge: 2024-02-19 | Disposition: A | Discharge status: Home / Self Care | Source: Ambulatory Visit | Attending: Family Medicine | Admitting: Family Medicine

## 2024-02-19 ENCOUNTER — Ambulatory Visit (INDEPENDENT_AMBULATORY_CARE_PROVIDER_SITE_OTHER): Admitting: Family Medicine

## 2024-02-19 ENCOUNTER — Encounter: Payer: Self-pay | Admitting: Family Medicine

## 2024-02-19 VITALS — BP 129/88 | HR 86 | Temp 97.8°F | Ht 65.0 in | Wt 133.4 lb

## 2024-02-19 DIAGNOSIS — F322 Major depressive disorder, single episode, severe without psychotic features: Secondary | ICD-10-CM | POA: Diagnosis not present

## 2024-02-19 DIAGNOSIS — Z0000 Encounter for general adult medical examination without abnormal findings: Secondary | ICD-10-CM | POA: Insufficient documentation

## 2024-02-19 MED ORDER — FLUOXETINE HCL 40 MG PO CAPS: 40.0000 mg | ORAL_CAPSULE | Freq: Every day | ORAL | 0 refills | Status: DC

## 2024-02-19 NOTE — Assessment & Plan Note (Signed)
 Acting up a bit- will increase her prozac  to 40mg  and recheck in 4-6 weeks. Call with any concerns.

## 2024-02-19 NOTE — Progress Notes (Signed)
 BP 129/88 (BP Location: Left Arm, Patient Position: Sitting)   Pulse 86   Temp 97.8 F (36.6 C) (Oral)   Ht 5' 5 (1.651 m)   Wt 133 lb 6.4 oz (60.5 kg)   LMP 12/30/2023   SpO2 100%   BMI 22.20 kg/m    Subjective:    Patient ID: Katie Bradford, female    DOB: 08-21-2000, 23 y.o.   MRN: [id number]  HPI: Katie Bradford is a 23 y.o. female presenting on 02/19/2024 for comprehensive medical examination. Current medical complaints include:  DEPRESSION Mood status: exacerbated Satisfied with current treatment?: yes Symptom severity: moderate  Duration of current treatment : chronic Side effects: no Medication compliance: excellent compliance Psychotherapy/counseling: no  Previous psychiatric medications: prozac  Depressed mood: yes Anxious mood: yes Anhedonia: no Significant weight loss or gain: no Insomnia: no  Fatigue: yes Feelings of worthlessness or guilt: yes Impaired concentration/indecisiveness: no Suicidal ideations: no Hopelessness: no Crying spells: no    02/13/2023   10:52 AM 11/29/2022    9:10 AM 01/28/2020    2:38 PM 01/20/2019    8:24 AM 10/21/2017    1:55 PM  Depression screen PHQ 2/9  Decreased Interest 1 2 0 0 0  Down, Depressed, Hopeless 1 2 0 0 0  PHQ - 2 Score 2 4 0 0 0  Altered sleeping 1 0 0 0 0  Tired, decreased energy 1 3 0 0 1  Change in appetite 0 1 0 0 1  Feeling bad or failure about yourself  2 3 0 0 1  Trouble concentrating 2 1 0 0 0  Moving slowly or fidgety/restless 0 2 0 0 0  Suicidal thoughts 2 3 0 0 0  PHQ-9 Score 10 17 0 0 3  Difficult doing work/chores Somewhat difficult Very difficult Not difficult at all Not difficult at all    Menopausal Symptoms: no  Depression Screen done today and results listed below:     02/13/2023   10:52 AM 11/29/2022    9:10 AM 01/28/2020    2:38 PM 01/20/2019    8:24 AM 10/21/2017    1:55 PM  Depression screen PHQ 2/9  Decreased Interest 1 2 0 0 0  Down, Depressed, Hopeless 1 2 0 0 0  PHQ - 2  Score 2 4 0 0 0  Altered sleeping 1 0 0 0 0  Tired, decreased energy 1 3 0 0 1  Change in appetite 0 1 0 0 1  Feeling bad or failure about yourself  2 3 0 0 1  Trouble concentrating 2 1 0 0 0  Moving slowly or fidgety/restless 0 2 0 0 0  Suicidal thoughts 2 3 0 0 0  PHQ-9 Score 10 17 0 0 3  Difficult doing work/chores Somewhat difficult Very difficult Not difficult at all Not difficult at all     Past Medical History:  History reviewed. No pertinent past medical history.  Surgical History:  History reviewed. No pertinent surgical history.  Medications:  No current outpatient medications on file prior to visit.   No current facility-administered medications on file prior to visit.    Allergies:  No Known Allergies  Social History:  Social History   Socioeconomic History   Marital status: Single    Spouse name: Not on file   Number of children: Not on file   Years of education: Not on file   Highest education level: Bachelor's degree (e.g., BA, AB, BS)  Occupational History   Not on  file  Tobacco Use   Smoking status: Never   Smokeless tobacco: Never  Vaping Use   Vaping status: Never Used  Substance and Sexual Activity   Alcohol use: Yes    Comment: socially   Drug use: No   Sexual activity: Yes  Other Topics Concern   Not on file  Social History Narrative   Not on file   Social Drivers of Health   Financial Resource Strain: Medium Risk (10/02/2023)   Overall Financial Resource Strain (CARDIA)    Difficulty of Paying Living Expenses: Somewhat hard  Food Insecurity: No Food Insecurity (10/02/2023)   Hunger Vital Sign    Worried About Running Out of Food in the Last Year: Never true    Ran Out of Food in the Last Year: Never true  Transportation Needs: No Transportation Needs (10/02/2023)   PRAPARE - Administrator, Civil Service (Medical): No    Lack of Transportation (Non-Medical): No  Physical Activity: Insufficiently Active (10/02/2023)    Exercise Vital Sign    Days of Exercise per Week: 4 days    Minutes of Exercise per Session: 30 min  Stress: Stress Concern Present (10/02/2023)   [employer] of Occupational Health - Occupational Stress Questionnaire    Feeling of Stress : To some extent  Social Connections: Moderately Isolated (10/02/2023)   Social Connection and Isolation Panel    Frequency of Communication with Friends and Family: Three times a week    Frequency of Social Gatherings with Friends and Family: Once a week    Attends Religious Services: Never    Database administrator or Organizations: No    Attends Engineer, structural: Not on file    Marital Status: Living with partner  Intimate Partner Violence: Not on file   Social History   Tobacco Use  Smoking Status Never  Smokeless Tobacco Never   Social History   Substance and Sexual Activity  Alcohol Use Yes   Comment: socially    Family History:  Family History  Problem Relation Age of Onset   Heart disease Father    Hypertension Father    Cancer Maternal Grandfather        pancreatic   Diabetes Paternal Grandmother    Prostate cancer Paternal Grandfather    Diabetes Paternal Aunt    Diabetes Paternal Uncle    Stroke Neg Hx    COPD Neg Hx     Past medical history, surgical history, medications, allergies, family history and social history reviewed with patient today and changes made to appropriate areas of the chart.   Review of Systems  Constitutional: Negative.   HENT: Negative.    Eyes: Negative.   Respiratory: Negative.    Cardiovascular: Negative.   Gastrointestinal: Negative.   Genitourinary: Negative.   Musculoskeletal: Negative.   Skin: Negative.   Neurological: Negative.   Endo/Heme/Allergies: Negative.   Psychiatric/Behavioral:  Positive for depression. Negative for hallucinations, memory loss, substance abuse and suicidal ideas. The patient is nervous/anxious. The patient does not have insomnia.    All other  ROS negative except what is listed above and in the HPI.      Objective:    BP 129/88 (BP Location: Left Arm, Patient Position: Sitting)   Pulse 86   Temp 97.8 F (36.6 C) (Oral)   Ht 5' 5 (1.651 m)   Wt 133 lb 6.4 oz (60.5 kg)   LMP 12/30/2023   SpO2 100%   BMI 22.20 kg/m  Wt Readings from Last 3 Encounters:  02/19/24 133 lb 6.4 oz (60.5 kg)  10/02/23 130 lb (59 kg)  11/29/22 137 lb 12.8 oz (62.5 kg)    Physical Exam Vitals and nursing note reviewed.  Constitutional:      General: She is not in acute distress.    Appearance: Normal appearance. She is not ill-appearing, toxic-appearing or diaphoretic.  HENT:     Head: Normocephalic and atraumatic.     Right Ear: Tympanic membrane, ear canal and external ear normal. There is no impacted cerumen.     Left Ear: Tympanic membrane, ear canal and external ear normal. There is no impacted cerumen.     Nose: Nose normal. No congestion or rhinorrhea.     Mouth/Throat:     Mouth: Mucous membranes are moist.     Pharynx: Oropharynx is clear. No oropharyngeal exudate or posterior oropharyngeal erythema.  Eyes:     General: No scleral icterus.       Right eye: No discharge.        Left eye: No discharge.     Extraocular Movements: Extraocular movements intact.     Conjunctiva/sclera: Conjunctivae normal.     Pupils: Pupils are equal, round, and reactive to light.  Neck:     Vascular: No carotid bruit.  Cardiovascular:     Rate and Rhythm: Normal rate and regular rhythm.     Pulses: Normal pulses.     Heart sounds: No murmur heard.    No friction rub. No gallop.  Pulmonary:     Effort: Pulmonary effort is normal. No respiratory distress.     Breath sounds: Normal breath sounds. No stridor. No wheezing, rhonchi or rales.  Chest:     Chest wall: No tenderness.  Abdominal:     General: Abdomen is flat. Bowel sounds are normal. There is no distension.     Palpations: Abdomen is soft. There is no mass.     Tenderness: There is  no abdominal tenderness. There is no right CVA tenderness, left CVA tenderness, guarding or rebound.     Hernia: No hernia is present.  Genitourinary:    Labia:        Right: No rash, tenderness, lesion or injury.        Left: No rash, tenderness, lesion or injury.      Vagina: No signs of injury and foreign body. Vaginal discharge present. No erythema, tenderness, bleeding, lesions or prolapsed vaginal walls.     Cervix: Normal.     Uterus: Normal.      Adnexa: Right adnexa normal and left adnexa normal.     Comments: Breast exams deferred with shared decision making Musculoskeletal:        General: No swelling, tenderness, deformity or signs of injury.     Cervical back: Normal range of motion and neck supple. No rigidity. No muscular tenderness.     Right lower leg: No edema.     Left lower leg: No edema.  Lymphadenopathy:     Cervical: No cervical adenopathy.  Skin:    General: Skin is warm and dry.     Capillary Refill: Capillary refill takes less than 2 seconds.     Coloration: Skin is not jaundiced or pale.     Findings: No bruising, erythema, lesion or rash.  Neurological:     General: No focal deficit present.     Mental Status: She is alert and oriented to person, place, and time. Mental status is at baseline.  Cranial Nerves: No cranial nerve deficit.     Sensory: No sensory deficit.     Motor: No weakness.     Coordination: Coordination normal.     Gait: Gait normal.     Deep Tendon Reflexes: Reflexes normal.  Psychiatric:        Mood and Affect: Mood normal.        Behavior: Behavior normal.        Thought Content: Thought content normal.        Judgment: Judgment normal.     Results for orders placed or performed in visit on 11/29/22  Urinalysis, Routine w reflex microscopic   Collection Time: 11/29/22  9:50 AM  Result Value Ref Range   Specific Gravity, UA 1.015 1.005 - 1.030   pH, UA 7.0 5.0 - 7.5   Color, UA Yellow Yellow   Appearance Ur Clear Clear    Leukocytes,UA Negative Negative   Protein,UA Negative Negative/Trace   Glucose, UA Negative Negative   Ketones, UA Negative Negative   RBC, UA Negative Negative   Bilirubin, UA Negative Negative   Urobilinogen, Ur 0.2 0.2 - 1.0 mg/dL   Nitrite, UA Negative Negative   Microscopic Examination Comment   CBC with Differential/Platelet   Collection Time: 11/29/22  9:54 AM  Result Value Ref Range   WBC 5.5 3.4 - 10.8 x10E3/uL   RBC 4.75 3.77 - 5.28 x10E6/uL   Hemoglobin 14.8 11.1 - 15.9 g/dL   Hematocrit 56.2 65.9 - 46.6 %   MCV 92 79 - 97 fL   MCH 31.2 26.6 - 33.0 pg   MCHC 33.9 31.5 - 35.7 g/dL   RDW 87.6 88.2 - 84.5 %   Platelets 264 150 - 450 x10E3/uL   Neutrophils 48 Not Estab. %   Lymphs 42 Not Estab. %   Monocytes 6 Not Estab. %   Eos 3 Not Estab. %   Basos 1 Not Estab. %   Neutrophils Absolute 2.7 1.4 - 7.0 x10E3/uL   Lymphocytes Absolute 2.3 0.7 - 3.1 x10E3/uL   Monocytes Absolute 0.3 0.1 - 0.9 x10E3/uL   EOS (ABSOLUTE) 0.2 0.0 - 0.4 x10E3/uL   Basophils Absolute 0.0 0.0 - 0.2 x10E3/uL   Immature Granulocytes 0 Not Estab. %   Immature Grans (Abs) 0.0 0.0 - 0.1 x10E3/uL  Comprehensive metabolic panel   Collection Time: 11/29/22  9:54 AM  Result Value Ref Range   Glucose 91 70 - 99 mg/dL   BUN 16 6 - 20 mg/dL   Creatinine, Ser 9.00 0.57 - 1.00 mg/dL   eGFR 82 >40 fO/fpw/8.26   BUN/Creatinine Ratio 16 9 - 23   Sodium 139 134 - 144 mmol/L   Potassium 4.2 3.5 - 5.2 mmol/L   Chloride 103 96 - 106 mmol/L   CO2 21 20 - 29 mmol/L   Calcium 9.5 8.7 - 10.2 mg/dL   Total Protein 7.3 6.0 - 8.5 g/dL   Albumin 4.6 4.0 - 5.0 g/dL   Globulin, Total 2.7 1.5 - 4.5 g/dL   Albumin/Globulin Ratio 1.7 1.2 - 2.2   Bilirubin Total 1.0 0.0 - 1.2 mg/dL   Alkaline Phosphatase 94 44 - 121 IU/L   AST 103 (H) 0 - 40 IU/L   ALT 42 (H) 0 - 32 IU/L  Lipid Panel w/o Chol/HDL Ratio   Collection Time: 11/29/22  9:54 AM  Result Value Ref Range   Cholesterol, Total 151 100 - 199 mg/dL    Triglycerides 59 0 - 149 mg/dL  HDL 57 >39 mg/dL   VLDL Cholesterol Cal 12 5 - 40 mg/dL   LDL Chol Calc (NIH) 82 0 - 99 mg/dL  TSH   Collection Time: 11/29/22  9:54 AM  Result Value Ref Range   TSH 1.520 0.450 - 4.500 uIU/mL      Assessment & Plan:   Problem List Items Addressed This Visit       Other   Depression, major, single episode, severe (HCC)   Acting up a bit- will increase her prozac  to 40mg  and recheck in 4-6 weeks. Call with any concerns.       Relevant Medications   FLUoxetine  (PROZAC ) 40 MG capsule   Other Visit Diagnoses       Routine general medical examination at a health care facility    -  Primary   Vaccines up to date. Screening labs checked today. Pap done. Continue diet and exercise. Call with any concerns.   Relevant Orders   CBC with Differential/Platelet   Comprehensive metabolic panel with GFR   Lipid Panel w/o Chol/HDL Ratio   Cytology - PAP   TSH        Follow up plan: Return in about 6 weeks (around 04/01/2024) for virtual OK.   LABORATORY TESTING:  - Pap smear: pap done  IMMUNIZATIONS:   - Tdap: Tetanus vaccination status reviewed: last tetanus booster within 10 years. - Influenza: Refused - Pneumovax: Not applicable - Prevnar: Up to date - COVID: Refused - HPV: Up to date  PATIENT COUNSELING:   Advised to take 1 mg of folate supplement per day if capable of pregnancy.   Sexuality: Discussed sexually transmitted diseases, partner selection, use of condoms, avoidance of unintended pregnancy  and contraceptive alternatives.   Advised to avoid cigarette smoking.  I discussed with the patient that most people either abstain from alcohol or drink within safe limits (<=14/week and <=4 drinks/occasion for males, <=7/weeks and <= 3 drinks/occasion for females) and that the risk for alcohol disorders and other health effects rises proportionally with the number of drinks per week and how often a drinker exceeds daily limits.  Discussed  cessation/primary prevention of drug use and availability of treatment for abuse.   Diet: Encouraged to adjust caloric intake to maintain  or achieve ideal body weight, to reduce intake of dietary saturated fat and total fat, to limit sodium intake by avoiding high sodium foods and not adding table salt, and to maintain adequate dietary potassium and calcium preferably from fresh fruits, vegetables, and low-fat dairy products.    stressed the importance of regular exercise  Injury prevention: Discussed safety belts, safety helmets, smoke detector, smoking near bedding or upholstery.   Dental health: Discussed importance of regular tooth brushing, flossing, and dental visits.    NEXT PREVENTATIVE PHYSICAL DUE IN 1 YEAR. Return in about 6 weeks (around 04/01/2024) for virtual OK.

## 2024-02-20 LAB — CBC WITH DIFFERENTIAL/PLATELET
Basophils Absolute: 0.1 x10E3/uL (ref 0.0–0.2)
Basos: 1 %
EOS (ABSOLUTE): 0.1 x10E3/uL (ref 0.0–0.4)
Eos: 2 %
Hematocrit: 45.1 % (ref 34.0–46.6)
Hemoglobin: 14.7 g/dL (ref 11.1–15.9)
Immature Grans (Abs): 0 x10E3/uL (ref 0.0–0.1)
Immature Granulocytes: 0 %
Lymphocytes Absolute: 2.2 x10E3/uL (ref 0.7–3.1)
Lymphs: 39 %
MCH: 30.8 pg (ref 26.6–33.0)
MCHC: 32.6 g/dL (ref 31.5–35.7)
MCV: 95 fL (ref 79–97)
Monocytes Absolute: 0.4 x10E3/uL (ref 0.1–0.9)
Monocytes: 6 %
Neutrophils Absolute: 2.9 x10E3/uL (ref 1.4–7.0)
Neutrophils: 52 %
Platelets: 250 x10E3/uL (ref 150–450)
RBC: 4.77 x10E6/uL (ref 3.77–5.28)
RDW: 12 % (ref 11.7–15.4)
WBC: 5.7 x10E3/uL (ref 3.4–10.8)

## 2024-02-20 LAB — COMPREHENSIVE METABOLIC PANEL WITH GFR
ALT: 11 IU/L (ref 0–32)
AST: 23 IU/L (ref 0–40)
Albumin: 4.7 g/dL (ref 4.0–5.0)
Alkaline Phosphatase: 68 IU/L (ref 44–121)
BUN/Creatinine Ratio: 16 (ref 9–23)
BUN: 14 mg/dL (ref 6–20)
Bilirubin Total: 1.3 mg/dL — ABNORMAL HIGH (ref 0.0–1.2)
CO2: 19 mmol/L — ABNORMAL LOW (ref 20–29)
Calcium: 9.5 mg/dL (ref 8.7–10.2)
Chloride: 102 mmol/L (ref 96–106)
Creatinine, Ser: 0.88 mg/dL (ref 0.57–1.00)
Globulin, Total: 2.8 g/dL (ref 1.5–4.5)
Glucose: 82 mg/dL (ref 70–99)
Potassium: 3.9 mmol/L (ref 3.5–5.2)
Sodium: 137 mmol/L (ref 134–144)
Total Protein: 7.5 g/dL (ref 6.0–8.5)
eGFR: 93 mL/min/1.73 (ref >59)

## 2024-02-20 LAB — TSH: TSH: 1.32 u[IU]/mL (ref 0.450–4.500)

## 2024-02-20 LAB — LIPID PANEL W/O CHOL/HDL RATIO
Cholesterol, Total: 166 mg/dL (ref 100–199)
HDL: 53 mg/dL (ref >39)
LDL Chol Calc (NIH): 103 mg/dL — ABNORMAL HIGH (ref 0–99)
Triglycerides: 48 mg/dL (ref 0–149)
VLDL Cholesterol Cal: 10 mg/dL (ref 5–40)

## 2024-02-21 ENCOUNTER — Ambulatory Visit: Payer: Self-pay | Admitting: Family Medicine

## 2024-02-21 LAB — CYTOLOGY - PAP: Diagnosis: NEGATIVE

## 2024-02-27 MED ORDER — METRONIDAZOLE 500 MG PO TABS: 500.0000 mg | ORAL_TABLET | Freq: Two times a day (BID) | ORAL | 0 refills | Status: AC

## 2024-03-09 ENCOUNTER — Encounter: Payer: Self-pay | Admitting: Family Medicine

## 2024-03-26 ENCOUNTER — Ambulatory Visit: Admitting: Family Medicine

## 2024-04-06 ENCOUNTER — Encounter: Payer: Self-pay | Admitting: Family Medicine

## 2024-04-06 ENCOUNTER — Telehealth (INDEPENDENT_AMBULATORY_CARE_PROVIDER_SITE_OTHER): Admitting: Family Medicine

## 2024-04-06 VITALS — Ht 65.0 in | Wt 126.0 lb

## 2024-04-06 DIAGNOSIS — F322 Major depressive disorder, single episode, severe without psychotic features: Secondary | ICD-10-CM | POA: Diagnosis not present

## 2024-04-06 MED ORDER — FLUOXETINE HCL 40 MG PO CAPS: 40.0000 mg | ORAL_CAPSULE | Freq: Every day | ORAL | 0 refills | Status: DC

## 2024-04-06 NOTE — Assessment & Plan Note (Signed)
 Doing a bit better on the higher dose of prozac  but was feeling pretty anxious- starting to even out. Will stay on current regimen and check in by mychart in about 3 weeks and via virtual visit in about 6 weeks. Call with any concerns. Refills given.

## 2024-04-06 NOTE — Progress Notes (Signed)
 Ht 5' 5 (1.651 m)   Wt 126 lb (57.2 kg)   LMP 02/27/2024   BMI 20.97 kg/m    Subjective:    Patient ID: Katie Bradford, female    DOB: Jun 13, 2001, 23 y.o.   MRN: [id number]  HPI: Katie Bradford is a 23 y.o. female  Chief Complaint  Patient presents with   Depression   DEPRESSION- feels like she's leveling out. Had been really anxious and feeling like her heart is racing and feeling like the meds were turning everything up. Starting to get her appetite back, feeling more chaotic in her head Mood status: better Satisfied with current treatment?: no Symptom severity: mild  Duration of current treatment : months Side effects: yes Medication compliance: good compliance Psychotherapy/counseling: yes  Previous psychiatric medications: prozac  Depressed mood: yes Anxious mood: yes Anhedonia: no Significant weight loss or gain: yes Insomnia: no  Fatigue: yes Feelings of worthlessness or guilt: no Impaired concentration/indecisiveness: no Suicidal ideations: no Hopelessness: no Crying spells: no    04/06/2024    3:55 PM 02/13/2023   10:52 AM 11/29/2022    9:10 AM 01/28/2020    2:38 PM 01/20/2019    8:24 AM  Depression screen PHQ 2/9  Decreased Interest 1 1 2  0 0  Down, Depressed, Hopeless 1 1 2  0 0  PHQ - 2 Score 2 2 4  0 0  Altered sleeping 0 1 0 0 0  Tired, decreased energy 0 1 3 0 0  Change in appetite 2 0 1 0 0  Feeling bad or failure about yourself  1 2 3  0 0  Trouble concentrating 1 2 1  0 0  Moving slowly or fidgety/restless 1 0 2 0 0  Suicidal thoughts 0 2 3 0 0  PHQ-9 Score 7 10 17  0 0  Difficult doing work/chores Somewhat difficult Somewhat difficult Very difficult Not difficult at all Not difficult at all      04/06/2024    3:57 PM 02/13/2023   10:54 AM 11/29/2022    9:10 AM 01/20/2019    8:24 AM  GAD 7 : Generalized Anxiety Score  Nervous, Anxious, on Edge 2 1 3  0  Control/stop worrying 2 2 3  0  Worry too much - different things 1 2 3  0  Trouble relaxing 1  0 2 0  Restless 2 2 2  0  Easily annoyed or irritable 0 0 2 0  Afraid - awful might happen 1 0 0 0  Total GAD 7 Score 9 7 15  0  Anxiety Difficulty Very difficult Somewhat difficult  Not difficult at all      Relevant past medical, surgical, family and social history reviewed and updated as indicated. Interim medical history since our last visit reviewed. Allergies and medications reviewed and updated.  Review of Systems  Constitutional: Negative.   Respiratory: Negative.    Cardiovascular: Negative.   Neurological: Negative.   Psychiatric/Behavioral:  Positive for dysphoric mood. Negative for agitation, behavioral problems, confusion, decreased concentration, hallucinations, self-injury, sleep disturbance and suicidal ideas. The patient is nervous/anxious. The patient is not hyperactive.     Per HPI unless specifically indicated above     Objective:    Ht 5' 5 (1.651 m)   Wt 126 lb (57.2 kg)   LMP 02/27/2024   BMI 20.97 kg/m   Wt Readings from Last 3 Encounters:  04/06/24 126 lb (57.2 kg)  02/19/24 133 lb 6.4 oz (60.5 kg)  10/02/23 130 lb (59 kg)    Physical Exam Vitals  and nursing note reviewed.  Constitutional:      General: She is not in acute distress.    Appearance: Normal appearance. She is not ill-appearing, toxic-appearing or diaphoretic.  HENT:     Head: Normocephalic and atraumatic.     Right Ear: External ear normal.     Left Ear: External ear normal.     Nose: Nose normal.     Mouth/Throat:     Mouth: Mucous membranes are moist.     Pharynx: Oropharynx is clear.  Eyes:     General: No scleral icterus.       Right eye: No discharge.        Left eye: No discharge.     Conjunctiva/sclera: Conjunctivae normal.     Pupils: Pupils are equal, round, and reactive to light.  Pulmonary:     Effort: Pulmonary effort is normal. No respiratory distress.     Comments: Speaking in full sentences Musculoskeletal:        General: Normal range of motion.      Cervical back: Normal range of motion.  Skin:    Coloration: Skin is not jaundiced or pale.     Findings: No bruising, erythema, lesion or rash.  Neurological:     Mental Status: She is alert and oriented to person, place, and time. Mental status is at baseline.  Psychiatric:        Mood and Affect: Mood normal.        Behavior: Behavior normal.        Thought Content: Thought content normal.        Judgment: Judgment normal.     Results for orders placed or performed in visit on 02/19/24  Cytology - PAP   Collection Time: 02/19/24 10:41 AM  Result Value Ref Range   Adequacy      Satisfactory for evaluation; transformation zone component PRESENT.   Diagnosis      - Negative for intraepithelial lesion or malignancy (NILM)   Microorganisms Shift in flora suggestive of bacterial vaginosis   CBC with Differential/Platelet   Collection Time: 02/19/24 10:42 AM  Result Value Ref Range   WBC 5.7 3.4 - 10.8 x10E3/uL   RBC 4.77 3.77 - 5.28 x10E6/uL   Hemoglobin 14.7 11.1 - 15.9 g/dL   Hematocrit 54.8 65.9 - 46.6 %   MCV 95 79 - 97 fL   MCH 30.8 26.6 - 33.0 pg   MCHC 32.6 31.5 - 35.7 g/dL   RDW 87.9 88.2 - 84.5 %   Platelets 250 150 - 450 x10E3/uL   Neutrophils 52 Not Estab. %   Lymphs 39 Not Estab. %   Monocytes 6 Not Estab. %   Eos 2 Not Estab. %   Basos 1 Not Estab. %   Neutrophils Absolute 2.9 1.4 - 7.0 x10E3/uL   Lymphocytes Absolute 2.2 0.7 - 3.1 x10E3/uL   Monocytes Absolute 0.4 0.1 - 0.9 x10E3/uL   EOS (ABSOLUTE) 0.1 0.0 - 0.4 x10E3/uL   Basophils Absolute 0.1 0.0 - 0.2 x10E3/uL   Immature Granulocytes 0 Not Estab. %   Immature Grans (Abs) 0.0 0.0 - 0.1 x10E3/uL  Comprehensive metabolic panel with GFR   Collection Time: 02/19/24 10:42 AM  Result Value Ref Range   Glucose 82 70 - 99 mg/dL   BUN 14 6 - 20 mg/dL   Creatinine, Ser 9.11 0.57 - 1.00 mg/dL   eGFR 93 >40 fO/fpw/8.26   BUN/Creatinine Ratio 16 9 - 23   Sodium 137 134 - 144  mmol/L   Potassium 3.9 3.5 - 5.2  mmol/L   Chloride 102 96 - 106 mmol/L   CO2 19 (L) 20 - 29 mmol/L   Calcium 9.5 8.7 - 10.2 mg/dL   Total Protein 7.5 6.0 - 8.5 g/dL   Albumin 4.7 4.0 - 5.0 g/dL   Globulin, Total 2.8 1.5 - 4.5 g/dL   Bilirubin Total 1.3 (H) 0.0 - 1.2 mg/dL   Alkaline Phosphatase 68 44 - 121 IU/L   AST 23 0 - 40 IU/L   ALT 11 0 - 32 IU/L  Lipid Panel w/o Chol/HDL Ratio   Collection Time: 02/19/24 10:42 AM  Result Value Ref Range   Cholesterol, Total 166 100 - 199 mg/dL   Triglycerides 48 0 - 149 mg/dL   HDL 53 >60 mg/dL   VLDL Cholesterol Cal 10 5 - 40 mg/dL   LDL Chol Calc (NIH) 896 (H) 0 - 99 mg/dL  TSH   Collection Time: 02/19/24 10:42 AM  Result Value Ref Range   TSH 1.320 0.450 - 4.500 uIU/mL      Assessment & Plan:   Problem List Items Addressed This Visit       Other   Depression, major, single episode, severe (HCC) - Primary   Doing a bit better on the higher dose of prozac  but was feeling pretty anxious- starting to even out. Will stay on current regimen and check in by mychart in about 3 weeks and via virtual visit in about 6 weeks. Call with any concerns. Refills given.       Relevant Medications   FLUoxetine  (PROZAC ) 40 MG capsule     Follow up plan: Return in about 6 weeks (around 05/18/2024) for virtual OK.   This visit was completed via video visit through MyChart due to the restrictions of the COVID-19 pandemic. All issues as above were discussed and addressed. Physical exam was done as above through visual confirmation on video through MyChart. If it was felt that the patient should be evaluated in the office, they were directed there. The patient verbally consented to this visit. Location of the patient: home Location of the provider: work Those involved with this call:  Provider: Duwaine Louder, DO CMA: York Fogo, CMA, Front Desk/Registration: Claretta Maiden  Time spent on call: 15 minutes with patient face to face via video conference. More than 50% of this time  was spent in counseling and coordination of care. 23 minutes total spent in review of patient's record and preparation of their chart.

## 2024-04-07 ENCOUNTER — Encounter: Admitting: Family Medicine

## 2024-04-08 NOTE — Progress Notes (Signed)
 Scheduled

## 2024-05-18 ENCOUNTER — Telehealth: Admitting: Family Medicine

## 2024-05-18 ENCOUNTER — Encounter: Payer: Self-pay | Admitting: Family Medicine

## 2024-05-18 DIAGNOSIS — F322 Major depressive disorder, single episode, severe without psychotic features: Secondary | ICD-10-CM | POA: Diagnosis not present

## 2024-05-18 MED ORDER — BUSPIRONE HCL 5 MG PO TABS: 5.0000 mg | ORAL_TABLET | Freq: Two times a day (BID) | ORAL | 2 refills | Status: DC | PRN

## 2024-05-18 NOTE — Progress Notes (Signed)
 There were no vitals taken for this visit.   Subjective:    Patient ID: Katie Bradford, female    DOB: 07-Oct-2000, 23 y.o.   MRN: [id number]  HPI: Katie Bradford is a 23 y.o. female  Chief Complaint  Patient presents with   Depression    Increased dose of Prozac . Discuss possibly adding another med for anxiety    ANXIETY/DEPRESSION Duration: chronic Status: depression is better and anxiety is worse Anxious mood: yes  Excessive worrying: yes Irritability: yes  Sweating: no Nausea: no Palpitations:no Hyperventilation: no Panic attacks: no Agoraphobia: yes  Obscessions/compulsions: yes Depressed mood: yes    05/18/2024    3:07 PM 04/06/2024    3:55 PM 02/13/2023   10:52 AM 11/29/2022    9:10 AM 01/28/2020    2:38 PM  Depression screen PHQ 2/9  Decreased Interest 0 1 1 2  0  Down, Depressed, Hopeless 1 1 1 2  0  PHQ - 2 Score 1 2 2 4  0  Altered sleeping 0 0 1 0 0  Tired, decreased energy 0 0 1 3 0  Change in appetite 2 2 0 1 0  Feeling bad or failure about yourself  1 1 2 3  0  Trouble concentrating 1 1 2 1  0  Moving slowly or fidgety/restless 3 1 0 2 0  Suicidal thoughts 0 0 2 3 0  PHQ-9 Score 8 7  10  17   0   Difficult doing work/chores Somewhat difficult Somewhat difficult Somewhat difficult Very difficult Not difficult at all     Data saved with a previous flowsheet row definition   Anhedonia: no Weight changes: no Insomnia: no   Hypersomnia: no Fatigue/loss of energy: yes Feelings of worthlessness: yes Feelings of guilt: yes Impaired concentration/indecisiveness: no Suicidal ideations: no  Crying spells: no Recent Stressors/Life Changes: yes   Relationship problems: no   Family stress: no     Financial stress: no    Job stress: no    Recent death/loss: no  Relevant past medical, surgical, family and social history reviewed and updated as indicated. Interim medical history since our last visit reviewed. Allergies and medications reviewed and  updated.  Review of Systems  Constitutional: Negative.   Respiratory: Negative.    Cardiovascular: Negative.   Musculoskeletal: Negative.   Psychiatric/Behavioral:  Positive for dysphoric mood. Negative for agitation, behavioral problems, confusion, decreased concentration, hallucinations, self-injury, sleep disturbance and suicidal ideas. The patient is nervous/anxious. The patient is not hyperactive.     Per HPI unless specifically indicated above     Objective:    There were no vitals taken for this visit.  Wt Readings from Last 3 Encounters:  04/06/24 126 lb (57.2 kg)  02/19/24 133 lb 6.4 oz (60.5 kg)  10/02/23 130 lb (59 kg)    Physical Exam Vitals and nursing note reviewed.  Constitutional:      General: She is not in acute distress.    Appearance: Normal appearance. She is not ill-appearing, toxic-appearing or diaphoretic.  HENT:     Head: Normocephalic and atraumatic.     Right Ear: External ear normal.     Left Ear: External ear normal.     Nose: Nose normal.     Mouth/Throat:     Mouth: Mucous membranes are moist.     Pharynx: Oropharynx is clear.  Eyes:     General: No scleral icterus.       Right eye: No discharge.        Left eye:  No discharge.     Conjunctiva/sclera: Conjunctivae normal.     Pupils: Pupils are equal, round, and reactive to light.  Pulmonary:     Effort: Pulmonary effort is normal. No respiratory distress.     Comments: Speaking in full sentences Musculoskeletal:        General: Normal range of motion.     Cervical back: Normal range of motion.  Skin:    Coloration: Skin is not jaundiced or pale.     Findings: No bruising, erythema, lesion or rash.  Neurological:     Mental Status: She is alert and oriented to person, place, and time. Mental status is at baseline.  Psychiatric:        Mood and Affect: Mood normal.        Behavior: Behavior normal.        Thought Content: Thought content normal.        Judgment: Judgment normal.      Results for orders placed or performed in visit on 02/19/24  Cytology - PAP   Collection Time: 02/19/24 10:41 AM  Result Value Ref Range   Adequacy      Satisfactory for evaluation; transformation zone component PRESENT.   Diagnosis      - Negative for intraepithelial lesion or malignancy (NILM)   Microorganisms Shift in flora suggestive of bacterial vaginosis   CBC with Differential/Platelet   Collection Time: 02/19/24 10:42 AM  Result Value Ref Range   WBC 5.7 3.4 - 10.8 x10E3/uL   RBC 4.77 3.77 - 5.28 x10E6/uL   Hemoglobin 14.7 11.1 - 15.9 g/dL   Hematocrit 54.8 65.9 - 46.6 %   MCV 95 79 - 97 fL   MCH 30.8 26.6 - 33.0 pg   MCHC 32.6 31.5 - 35.7 g/dL   RDW 87.9 88.2 - 84.5 %   Platelets 250 150 - 450 x10E3/uL   Neutrophils 52 Not Estab. %   Lymphs 39 Not Estab. %   Monocytes 6 Not Estab. %   Eos 2 Not Estab. %   Basos 1 Not Estab. %   Neutrophils Absolute 2.9 1.4 - 7.0 x10E3/uL   Lymphocytes Absolute 2.2 0.7 - 3.1 x10E3/uL   Monocytes Absolute 0.4 0.1 - 0.9 x10E3/uL   EOS (ABSOLUTE) 0.1 0.0 - 0.4 x10E3/uL   Basophils Absolute 0.1 0.0 - 0.2 x10E3/uL   Immature Granulocytes 0 Not Estab. %   Immature Grans (Abs) 0.0 0.0 - 0.1 x10E3/uL  Comprehensive metabolic panel with GFR   Collection Time: 02/19/24 10:42 AM  Result Value Ref Range   Glucose 82 70 - 99 mg/dL   BUN 14 6 - 20 mg/dL   Creatinine, Ser 9.11 0.57 - 1.00 mg/dL   eGFR 93 >40 fO/fpw/8.26   BUN/Creatinine Ratio 16 9 - 23   Sodium 137 134 - 144 mmol/L   Potassium 3.9 3.5 - 5.2 mmol/L   Chloride 102 96 - 106 mmol/L   CO2 19 (L) 20 - 29 mmol/L   Calcium 9.5 8.7 - 10.2 mg/dL   Total Protein 7.5 6.0 - 8.5 g/dL   Albumin 4.7 4.0 - 5.0 g/dL   Globulin, Total 2.8 1.5 - 4.5 g/dL   Bilirubin Total 1.3 (H) 0.0 - 1.2 mg/dL   Alkaline Phosphatase 68 44 - 121 IU/L   AST 23 0 - 40 IU/L   ALT 11 0 - 32 IU/L  Lipid Panel w/o Chol/HDL Ratio   Collection Time: 02/19/24 10:42 AM  Result Value Ref Range    Cholesterol,  Total 166 100 - 199 mg/dL   Triglycerides 48 0 - 149 mg/dL   HDL 53 >60 mg/dL   VLDL Cholesterol Cal 10 5 - 40 mg/dL   LDL Chol Calc (NIH) 896 (H) 0 - 99 mg/dL  TSH   Collection Time: 02/19/24 10:42 AM  Result Value Ref Range   TSH 1.320 0.450 - 4.500 uIU/mL      Assessment & Plan:   Problem List Items Addressed This Visit       Other   Depression, major, single episode, severe (HCC) - Primary   Improved, but anxiety worse. We will continue prozac  and add buspar. Check in by mychart in 2 weeks. Follow virtually in 6 weeks.       Relevant Medications   busPIRone (BUSPAR) 5 MG tablet     Follow up plan: Return in about 6 weeks (around 06/29/2024) for virtual OK.    This visit was completed via video visit through MyChart due to the restrictions of the COVID-19 pandemic. All issues as above were discussed and addressed. Physical exam was done as above through visual confirmation on video through MyChart. If it was felt that the patient should be evaluated in the office, they were directed there. The patient verbally consented to this visit. Location of the patient: home Location of the provider: work Those involved with this call:  Provider: Duwaine Louder, DO CMA: York Fogo, CMA, Front Desk/Registration: Claretta Maiden  Time spent on call: 15 minutes with patient face to face via video conference. More than 50% of this time was spent in counseling and coordination of care. 23 minutes total spent in review of patient's record and preparation of their chart.

## 2024-05-18 NOTE — Assessment & Plan Note (Signed)
 Improved, but anxiety worse. We will continue prozac  and add buspar. Check in by mychart in 2 weeks. Follow virtually in 6 weeks.

## 2024-05-19 NOTE — Progress Notes (Signed)
 Called patient lvm for patient to call office and schedule 6 weeks follow up

## 2024-07-06 ENCOUNTER — Encounter: Payer: Self-pay | Admitting: Family Medicine

## 2024-07-06 ENCOUNTER — Telehealth (INDEPENDENT_AMBULATORY_CARE_PROVIDER_SITE_OTHER): Admitting: Family Medicine

## 2024-07-06 VITALS — Wt 130.0 lb

## 2024-07-06 DIAGNOSIS — F322 Major depressive disorder, single episode, severe without psychotic features: Secondary | ICD-10-CM | POA: Diagnosis not present

## 2024-07-06 MED ORDER — BUSPIRONE HCL 5 MG PO TABS: 5.0000 mg | ORAL_TABLET | Freq: Two times a day (BID) | ORAL | 1 refills | Status: AC | PRN

## 2024-07-06 MED ORDER — FLUOXETINE HCL 40 MG PO CAPS: 40.0000 mg | ORAL_CAPSULE | Freq: Every day | ORAL | 1 refills | Status: AC

## 2024-07-06 NOTE — Progress Notes (Signed)
 "  Wt 130 lb (59 kg)   BMI 21.63 kg/m    Subjective:    Patient ID: Era Berrong, female    DOB: September 24, 2000, 24 y.o.   MRN: [id number]  HPI: Rhaya Coale is a 24 y.o. female  Chief Complaint  Patient presents with   Depression   ANXIETY/DEPRESSION Duration: chronic Status:better Anxious mood: yes  Excessive worrying: no Irritability: no  Sweating: no Nausea: no Palpitations:no Hyperventilation: no Panic attacks: no Agoraphobia: no  Obscessions/compulsions: no Depressed mood: no    07/06/2024    4:14 PM 05/18/2024    3:07 PM 04/06/2024    3:55 PM 02/13/2023   10:52 AM 11/29/2022    9:10 AM  Depression screen PHQ 2/9  Decreased Interest 0 0 1 1 2   Down, Depressed, Hopeless 0 1 1 1 2   PHQ - 2 Score 0 1 2 2 4   Altered sleeping 0 0 0 1 0  Tired, decreased energy 1 0 0 1 3  Change in appetite 0 2 2 0 1  Feeling bad or failure about yourself  0 1 1 2 3   Trouble concentrating 0 1 1 2 1   Moving slowly or fidgety/restless 0 3 1 0 2  Suicidal thoughts 0 0 0 2 3  PHQ-9 Score 1 8 7  10  17    Difficult doing work/chores Not difficult at all Somewhat difficult Somewhat difficult Somewhat difficult Very difficult     Data saved with a previous flowsheet row definition      07/06/2024    4:15 PM 05/18/2024    3:09 PM 04/06/2024    3:57 PM 02/13/2023   10:54 AM  GAD 7 : Generalized Anxiety Score  Nervous, Anxious, on Edge 1 3 2 1   Control/stop worrying 1 2 2 2   Worry too much - different things 1 2 1 2   Trouble relaxing 0 1 1 0  Restless 1 1 2 2   Easily annoyed or irritable 0 1 0 0  Afraid - awful might happen 0 2 1 0  Total GAD 7 Score 4 12 9 7   Anxiety Difficulty Not difficult at all Very difficult Very difficult Somewhat difficult   Anhedonia: no Weight changes: no Insomnia: no   Hypersomnia: no Fatigue/loss of energy: no Feelings of worthlessness: no Feelings of guilt: no Impaired concentration/indecisiveness: no Suicidal ideations: no  Crying spells:  no Recent Stressors/Life Changes: no   Relationship problems: no   Family stress: no     Financial stress: no    Job stress: no    Recent death/loss: no   Relevant past medical, surgical, family and social history reviewed and updated as indicated. Interim medical history since our last visit reviewed. Allergies and medications reviewed and updated.  Review of Systems  Constitutional: Negative.   Respiratory: Negative.    Cardiovascular: Negative.   Gastrointestinal: Negative.   Musculoskeletal: Negative.   Neurological: Negative.   Psychiatric/Behavioral: Negative.      Per HPI unless specifically indicated above     Objective:    Wt 130 lb (59 kg)   BMI 21.63 kg/m   Wt Readings from Last 3 Encounters:  07/06/24 130 lb (59 kg)  04/06/24 126 lb (57.2 kg)  02/19/24 133 lb 6.4 oz (60.5 kg)    Physical Exam Vitals and nursing note reviewed.  Constitutional:      General: She is not in acute distress.    Appearance: Normal appearance. She is not ill-appearing, toxic-appearing or diaphoretic.  HENT:  Head: Normocephalic and atraumatic.     Right Ear: External ear normal.     Left Ear: External ear normal.     Nose: Nose normal.     Mouth/Throat:     Mouth: Mucous membranes are moist.     Pharynx: Oropharynx is clear.  Eyes:     General: No scleral icterus.       Right eye: No discharge.        Left eye: No discharge.     Conjunctiva/sclera: Conjunctivae normal.     Pupils: Pupils are equal, round, and reactive to light.  Pulmonary:     Effort: Pulmonary effort is normal. No respiratory distress.     Comments: Speaking in full sentences Musculoskeletal:        General: Normal range of motion.     Cervical back: Normal range of motion.  Skin:    Coloration: Skin is not jaundiced or pale.     Findings: No bruising, erythema, lesion or rash.  Neurological:     Mental Status: She is alert and oriented to person, place, and time. Mental status is at baseline.   Psychiatric:        Mood and Affect: Mood normal.        Behavior: Behavior normal.        Thought Content: Thought content normal.        Judgment: Judgment normal.     Results for orders placed or performed in visit on 02/19/24  Cytology - PAP   Collection Time: 02/19/24 10:41 AM  Result Value Ref Range   Adequacy      Satisfactory for evaluation; transformation zone component PRESENT.   Diagnosis      - Negative for intraepithelial lesion or malignancy (NILM)   Microorganisms Shift in flora suggestive of bacterial vaginosis   CBC with Differential/Platelet   Collection Time: 02/19/24 10:42 AM  Result Value Ref Range   WBC 5.7 3.4 - 10.8 x10E3/uL   RBC 4.77 3.77 - 5.28 x10E6/uL   Hemoglobin 14.7 11.1 - 15.9 g/dL   Hematocrit 54.8 65.9 - 46.6 %   MCV 95 79 - 97 fL   MCH 30.8 26.6 - 33.0 pg   MCHC 32.6 31.5 - 35.7 g/dL   RDW 87.9 88.2 - 84.5 %   Platelets 250 150 - 450 x10E3/uL   Neutrophils 52 Not Estab. %   Lymphs 39 Not Estab. %   Monocytes 6 Not Estab. %   Eos 2 Not Estab. %   Basos 1 Not Estab. %   Neutrophils Absolute 2.9 1.4 - 7.0 x10E3/uL   Lymphocytes Absolute 2.2 0.7 - 3.1 x10E3/uL   Monocytes Absolute 0.4 0.1 - 0.9 x10E3/uL   EOS (ABSOLUTE) 0.1 0.0 - 0.4 x10E3/uL   Basophils Absolute 0.1 0.0 - 0.2 x10E3/uL   Immature Granulocytes 0 Not Estab. %   Immature Grans (Abs) 0.0 0.0 - 0.1 x10E3/uL  Comprehensive metabolic panel with GFR   Collection Time: 02/19/24 10:42 AM  Result Value Ref Range   Glucose 82 70 - 99 mg/dL   BUN 14 6 - 20 mg/dL   Creatinine, Ser 9.11 0.57 - 1.00 mg/dL   eGFR 93 >40 fO/fpw/8.26   BUN/Creatinine Ratio 16 9 - 23   Sodium 137 134 - 144 mmol/L   Potassium 3.9 3.5 - 5.2 mmol/L   Chloride 102 96 - 106 mmol/L   CO2 19 (L) 20 - 29 mmol/L   Calcium 9.5 8.7 - 10.2 mg/dL   Total  Protein 7.5 6.0 - 8.5 g/dL   Albumin 4.7 4.0 - 5.0 g/dL   Globulin, Total 2.8 1.5 - 4.5 g/dL   Bilirubin Total 1.3 (H) 0.0 - 1.2 mg/dL   Alkaline  Phosphatase 68 44 - 121 IU/L   AST 23 0 - 40 IU/L   ALT 11 0 - 32 IU/L  Lipid Panel w/o Chol/HDL Ratio   Collection Time: 02/19/24 10:42 AM  Result Value Ref Range   Cholesterol, Total 166 100 - 199 mg/dL   Triglycerides 48 0 - 149 mg/dL   HDL 53 >60 mg/dL   VLDL Cholesterol Cal 10 5 - 40 mg/dL   LDL Chol Calc (NIH) 896 (H) 0 - 99 mg/dL  TSH   Collection Time: 02/19/24 10:42 AM  Result Value Ref Range   TSH 1.320 0.450 - 4.500 uIU/mL      Assessment & Plan:   Problem List Items Addressed This Visit       Other   Depression, major, single episode, severe (HCC) - Primary   Under good control on current regimen. Continue current regimen. Continue to monitor. Call with any concerns. Refills given.        Relevant Medications   busPIRone  (BUSPAR ) 5 MG tablet   FLUoxetine  (PROZAC ) 40 MG capsule     Follow up plan: Return in about 4 months (around 11/03/2024).   This visit was completed via video visit through MyChart due to the restrictions of the COVID-19 pandemic. All issues as above were discussed and addressed. Physical exam was done as above through visual confirmation on video through MyChart. If it was felt that the patient should be evaluated in the office, they were directed there. The patient verbally consented to this visit. Location of the patient: home Location of the provider: work Those involved with this call:  Provider: Duwaine Louder, DO CMA: York Fogo, CMA, Front Desk/Registration: Claretta Maiden  Time spent on call: 15 minutes with patient face to face via video conference. More than 50% of this time was spent in counseling and coordination of care. 23 minutes total spent in review of patient's record and preparation of their chart.      "

## 2024-07-06 NOTE — Assessment & Plan Note (Signed)
 Under good control on current regimen. Continue current regimen. Continue to monitor. Call with any concerns. Refills given.

## 2024-11-03 ENCOUNTER — Ambulatory Visit: Admitting: Family Medicine
